# Patient Record
Sex: Female | Born: 1940 | Race: White | Hispanic: No | Marital: Married | Smoking: Never smoker
Health system: Southern US, Community
[De-identification: ages and names within clinical notes are randomized; demographics above are authoritative.]

## PROBLEM LIST (undated history)

## (undated) DIAGNOSIS — R51 Headache: Secondary | ICD-10-CM

## (undated) DIAGNOSIS — D649 Anemia, unspecified: Secondary | ICD-10-CM

## (undated) DIAGNOSIS — I10 Essential (primary) hypertension: Secondary | ICD-10-CM

## (undated) DIAGNOSIS — M1990 Unspecified osteoarthritis, unspecified site: Secondary | ICD-10-CM

## (undated) DIAGNOSIS — Z8669 Personal history of other diseases of the nervous system and sense organs: Secondary | ICD-10-CM

## (undated) DIAGNOSIS — K219 Gastro-esophageal reflux disease without esophagitis: Secondary | ICD-10-CM

## (undated) DIAGNOSIS — I341 Nonrheumatic mitral (valve) prolapse: Secondary | ICD-10-CM

## (undated) DIAGNOSIS — R519 Headache, unspecified: Secondary | ICD-10-CM

## (undated) DIAGNOSIS — Z8489 Family history of other specified conditions: Secondary | ICD-10-CM

## (undated) HISTORY — PX: TRIGGER FINGER RELEASE: SHX641

## (undated) HISTORY — PX: COLONOSCOPY: SHX174

---

## 2009-09-09 ENCOUNTER — Ambulatory Visit: Payer: Self-pay | Admitting: Cardiology

## 2011-04-06 DIAGNOSIS — S0120XA Unspecified open wound of nose, initial encounter: Secondary | ICD-10-CM | POA: Diagnosis not present

## 2011-04-06 DIAGNOSIS — M069 Rheumatoid arthritis, unspecified: Secondary | ICD-10-CM | POA: Diagnosis not present

## 2011-04-06 DIAGNOSIS — I10 Essential (primary) hypertension: Secondary | ICD-10-CM | POA: Diagnosis not present

## 2011-04-06 DIAGNOSIS — K219 Gastro-esophageal reflux disease without esophagitis: Secondary | ICD-10-CM | POA: Diagnosis not present

## 2011-07-05 DIAGNOSIS — L570 Actinic keratosis: Secondary | ICD-10-CM | POA: Diagnosis not present

## 2011-10-04 DIAGNOSIS — J0100 Acute maxillary sinusitis, unspecified: Secondary | ICD-10-CM | POA: Diagnosis not present

## 2012-01-03 DIAGNOSIS — I10 Essential (primary) hypertension: Secondary | ICD-10-CM | POA: Diagnosis not present

## 2012-01-03 DIAGNOSIS — M069 Rheumatoid arthritis, unspecified: Secondary | ICD-10-CM | POA: Diagnosis not present

## 2012-01-03 DIAGNOSIS — R5381 Other malaise: Secondary | ICD-10-CM | POA: Diagnosis not present

## 2012-01-03 DIAGNOSIS — Z23 Encounter for immunization: Secondary | ICD-10-CM | POA: Diagnosis not present

## 2012-01-03 DIAGNOSIS — E559 Vitamin D deficiency, unspecified: Secondary | ICD-10-CM | POA: Diagnosis not present

## 2012-01-03 DIAGNOSIS — Z130 Encounter for screening for diseases of the blood and blood-forming organs and certain disorders involving the immune mechanism: Secondary | ICD-10-CM | POA: Diagnosis not present

## 2012-01-03 DIAGNOSIS — Z1329 Encounter for screening for other suspected endocrine disorder: Secondary | ICD-10-CM | POA: Diagnosis not present

## 2012-01-05 DIAGNOSIS — H11049 Peripheral pterygium, stationary, unspecified eye: Secondary | ICD-10-CM | POA: Diagnosis not present

## 2012-01-05 DIAGNOSIS — Z79899 Other long term (current) drug therapy: Secondary | ICD-10-CM | POA: Diagnosis not present

## 2012-01-05 DIAGNOSIS — H2510 Age-related nuclear cataract, unspecified eye: Secondary | ICD-10-CM | POA: Diagnosis not present

## 2012-01-13 DIAGNOSIS — Z79899 Other long term (current) drug therapy: Secondary | ICD-10-CM | POA: Diagnosis not present

## 2012-02-03 DIAGNOSIS — F17200 Nicotine dependence, unspecified, uncomplicated: Secondary | ICD-10-CM | POA: Diagnosis not present

## 2012-02-03 DIAGNOSIS — Z882 Allergy status to sulfonamides status: Secondary | ICD-10-CM | POA: Diagnosis not present

## 2012-02-03 DIAGNOSIS — K219 Gastro-esophageal reflux disease without esophagitis: Secondary | ICD-10-CM | POA: Diagnosis not present

## 2012-02-03 DIAGNOSIS — K5732 Diverticulitis of large intestine without perforation or abscess without bleeding: Secondary | ICD-10-CM | POA: Diagnosis not present

## 2012-02-03 DIAGNOSIS — M129 Arthropathy, unspecified: Secondary | ICD-10-CM | POA: Diagnosis not present

## 2012-02-03 DIAGNOSIS — K5730 Diverticulosis of large intestine without perforation or abscess without bleeding: Secondary | ICD-10-CM | POA: Diagnosis not present

## 2012-02-03 DIAGNOSIS — Z1211 Encounter for screening for malignant neoplasm of colon: Secondary | ICD-10-CM | POA: Diagnosis not present

## 2012-02-03 DIAGNOSIS — I10 Essential (primary) hypertension: Secondary | ICD-10-CM | POA: Diagnosis not present

## 2012-04-04 DIAGNOSIS — J0110 Acute frontal sinusitis, unspecified: Secondary | ICD-10-CM | POA: Diagnosis not present

## 2012-06-01 DIAGNOSIS — Z1231 Encounter for screening mammogram for malignant neoplasm of breast: Secondary | ICD-10-CM | POA: Diagnosis not present

## 2012-07-03 DIAGNOSIS — Z0000 Encounter for general adult medical examination without abnormal findings: Secondary | ICD-10-CM | POA: Diagnosis not present

## 2012-07-03 DIAGNOSIS — I10 Essential (primary) hypertension: Secondary | ICD-10-CM | POA: Diagnosis not present

## 2012-10-16 DIAGNOSIS — I10 Essential (primary) hypertension: Secondary | ICD-10-CM | POA: Diagnosis not present

## 2012-11-01 DIAGNOSIS — Z01419 Encounter for gynecological examination (general) (routine) without abnormal findings: Secondary | ICD-10-CM | POA: Diagnosis not present

## 2012-12-21 DIAGNOSIS — Z23 Encounter for immunization: Secondary | ICD-10-CM | POA: Diagnosis not present

## 2013-01-16 DIAGNOSIS — I10 Essential (primary) hypertension: Secondary | ICD-10-CM | POA: Diagnosis not present

## 2013-01-16 DIAGNOSIS — Z131 Encounter for screening for diabetes mellitus: Secondary | ICD-10-CM | POA: Diagnosis not present

## 2013-05-04 DIAGNOSIS — I10 Essential (primary) hypertension: Secondary | ICD-10-CM | POA: Diagnosis not present

## 2013-08-09 DIAGNOSIS — I10 Essential (primary) hypertension: Secondary | ICD-10-CM | POA: Diagnosis not present

## 2013-08-09 DIAGNOSIS — Z0000 Encounter for general adult medical examination without abnormal findings: Secondary | ICD-10-CM | POA: Diagnosis not present

## 2013-11-12 DIAGNOSIS — I10 Essential (primary) hypertension: Secondary | ICD-10-CM | POA: Diagnosis not present

## 2013-11-12 DIAGNOSIS — K219 Gastro-esophageal reflux disease without esophagitis: Secondary | ICD-10-CM | POA: Diagnosis not present

## 2013-11-26 DIAGNOSIS — Z23 Encounter for immunization: Secondary | ICD-10-CM | POA: Diagnosis not present

## 2013-11-26 DIAGNOSIS — Z1231 Encounter for screening mammogram for malignant neoplasm of breast: Secondary | ICD-10-CM | POA: Diagnosis not present

## 2014-02-12 DIAGNOSIS — K219 Gastro-esophageal reflux disease without esophagitis: Secondary | ICD-10-CM | POA: Diagnosis not present

## 2014-02-12 DIAGNOSIS — Z131 Encounter for screening for diabetes mellitus: Secondary | ICD-10-CM | POA: Diagnosis not present

## 2014-02-12 DIAGNOSIS — I10 Essential (primary) hypertension: Secondary | ICD-10-CM | POA: Diagnosis not present

## 2014-02-12 DIAGNOSIS — M059 Rheumatoid arthritis with rheumatoid factor, unspecified: Secondary | ICD-10-CM | POA: Diagnosis not present

## 2014-05-23 DIAGNOSIS — M059 Rheumatoid arthritis with rheumatoid factor, unspecified: Secondary | ICD-10-CM | POA: Diagnosis not present

## 2014-05-23 DIAGNOSIS — I10 Essential (primary) hypertension: Secondary | ICD-10-CM | POA: Diagnosis not present

## 2014-08-23 DIAGNOSIS — M059 Rheumatoid arthritis with rheumatoid factor, unspecified: Secondary | ICD-10-CM | POA: Diagnosis not present

## 2014-08-23 DIAGNOSIS — I10 Essential (primary) hypertension: Secondary | ICD-10-CM | POA: Diagnosis not present

## 2014-08-23 DIAGNOSIS — J028 Acute pharyngitis due to other specified organisms: Secondary | ICD-10-CM | POA: Diagnosis not present

## 2014-08-23 DIAGNOSIS — Z131 Encounter for screening for diabetes mellitus: Secondary | ICD-10-CM | POA: Diagnosis not present

## 2014-11-26 DIAGNOSIS — M05852 Other rheumatoid arthritis with rheumatoid factor of left hip: Secondary | ICD-10-CM | POA: Diagnosis not present

## 2014-11-26 DIAGNOSIS — I10 Essential (primary) hypertension: Secondary | ICD-10-CM | POA: Diagnosis not present

## 2015-02-28 DIAGNOSIS — Z131 Encounter for screening for diabetes mellitus: Secondary | ICD-10-CM | POA: Diagnosis not present

## 2015-02-28 DIAGNOSIS — M0589 Other rheumatoid arthritis with rheumatoid factor of multiple sites: Secondary | ICD-10-CM | POA: Diagnosis not present

## 2015-02-28 DIAGNOSIS — I10 Essential (primary) hypertension: Secondary | ICD-10-CM | POA: Diagnosis not present

## 2015-02-28 DIAGNOSIS — Z0000 Encounter for general adult medical examination without abnormal findings: Secondary | ICD-10-CM | POA: Diagnosis not present

## 2015-02-28 DIAGNOSIS — Z1389 Encounter for screening for other disorder: Secondary | ICD-10-CM | POA: Diagnosis not present

## 2015-02-28 DIAGNOSIS — M542 Cervicalgia: Secondary | ICD-10-CM | POA: Diagnosis not present

## 2015-03-24 DIAGNOSIS — Z8262 Family history of osteoporosis: Secondary | ICD-10-CM | POA: Diagnosis not present

## 2015-03-24 DIAGNOSIS — M8589 Other specified disorders of bone density and structure, multiple sites: Secondary | ICD-10-CM | POA: Diagnosis not present

## 2015-03-24 DIAGNOSIS — I10 Essential (primary) hypertension: Secondary | ICD-10-CM | POA: Diagnosis not present

## 2015-03-24 DIAGNOSIS — K219 Gastro-esophageal reflux disease without esophagitis: Secondary | ICD-10-CM | POA: Diagnosis not present

## 2015-03-24 DIAGNOSIS — Z780 Asymptomatic menopausal state: Secondary | ICD-10-CM | POA: Diagnosis not present

## 2015-03-24 DIAGNOSIS — M419 Scoliosis, unspecified: Secondary | ICD-10-CM | POA: Diagnosis not present

## 2015-03-24 DIAGNOSIS — M810 Age-related osteoporosis without current pathological fracture: Secondary | ICD-10-CM | POA: Diagnosis not present

## 2015-03-24 DIAGNOSIS — Z79899 Other long term (current) drug therapy: Secondary | ICD-10-CM | POA: Diagnosis not present

## 2015-03-31 DIAGNOSIS — Z23 Encounter for immunization: Secondary | ICD-10-CM | POA: Diagnosis not present

## 2015-05-30 DIAGNOSIS — I10 Essential (primary) hypertension: Secondary | ICD-10-CM | POA: Diagnosis not present

## 2015-05-30 DIAGNOSIS — M0589 Other rheumatoid arthritis with rheumatoid factor of multiple sites: Secondary | ICD-10-CM | POA: Diagnosis not present

## 2015-05-30 DIAGNOSIS — M810 Age-related osteoporosis without current pathological fracture: Secondary | ICD-10-CM | POA: Diagnosis not present

## 2015-07-01 DIAGNOSIS — I10 Essential (primary) hypertension: Secondary | ICD-10-CM | POA: Diagnosis not present

## 2015-07-01 DIAGNOSIS — H6121 Impacted cerumen, right ear: Secondary | ICD-10-CM | POA: Diagnosis not present

## 2015-07-16 DIAGNOSIS — L821 Other seborrheic keratosis: Secondary | ICD-10-CM | POA: Diagnosis not present

## 2015-07-16 DIAGNOSIS — D1801 Hemangioma of skin and subcutaneous tissue: Secondary | ICD-10-CM | POA: Diagnosis not present

## 2015-08-01 DIAGNOSIS — G43109 Migraine with aura, not intractable, without status migrainosus: Secondary | ICD-10-CM | POA: Diagnosis not present

## 2015-08-01 DIAGNOSIS — I10 Essential (primary) hypertension: Secondary | ICD-10-CM | POA: Diagnosis not present

## 2015-09-05 DIAGNOSIS — M0589 Other rheumatoid arthritis with rheumatoid factor of multiple sites: Secondary | ICD-10-CM | POA: Diagnosis not present

## 2015-09-05 DIAGNOSIS — G43109 Migraine with aura, not intractable, without status migrainosus: Secondary | ICD-10-CM | POA: Diagnosis not present

## 2015-09-05 DIAGNOSIS — I10 Essential (primary) hypertension: Secondary | ICD-10-CM | POA: Diagnosis not present

## 2015-09-10 DIAGNOSIS — I10 Essential (primary) hypertension: Secondary | ICD-10-CM | POA: Diagnosis not present

## 2015-09-10 DIAGNOSIS — Z79899 Other long term (current) drug therapy: Secondary | ICD-10-CM | POA: Diagnosis not present

## 2015-09-22 DIAGNOSIS — Z1231 Encounter for screening mammogram for malignant neoplasm of breast: Secondary | ICD-10-CM | POA: Diagnosis not present

## 2015-11-21 DIAGNOSIS — Z23 Encounter for immunization: Secondary | ICD-10-CM | POA: Diagnosis not present

## 2015-12-09 DIAGNOSIS — M0589 Other rheumatoid arthritis with rheumatoid factor of multiple sites: Secondary | ICD-10-CM | POA: Diagnosis not present

## 2015-12-09 DIAGNOSIS — I10 Essential (primary) hypertension: Secondary | ICD-10-CM | POA: Diagnosis not present

## 2015-12-09 DIAGNOSIS — G43109 Migraine with aura, not intractable, without status migrainosus: Secondary | ICD-10-CM | POA: Diagnosis not present

## 2016-03-22 DIAGNOSIS — M0589 Other rheumatoid arthritis with rheumatoid factor of multiple sites: Secondary | ICD-10-CM | POA: Diagnosis not present

## 2016-03-22 DIAGNOSIS — Z131 Encounter for screening for diabetes mellitus: Secondary | ICD-10-CM | POA: Diagnosis not present

## 2016-03-22 DIAGNOSIS — G43109 Migraine with aura, not intractable, without status migrainosus: Secondary | ICD-10-CM | POA: Diagnosis not present

## 2016-03-22 DIAGNOSIS — Z1389 Encounter for screening for other disorder: Secondary | ICD-10-CM | POA: Diagnosis not present

## 2016-03-22 DIAGNOSIS — I10 Essential (primary) hypertension: Secondary | ICD-10-CM | POA: Diagnosis not present

## 2016-03-22 DIAGNOSIS — Z0000 Encounter for general adult medical examination without abnormal findings: Secondary | ICD-10-CM | POA: Diagnosis not present

## 2016-03-22 DIAGNOSIS — N182 Chronic kidney disease, stage 2 (mild): Secondary | ICD-10-CM | POA: Diagnosis not present

## 2016-04-26 DIAGNOSIS — M1990 Unspecified osteoarthritis, unspecified site: Secondary | ICD-10-CM | POA: Diagnosis not present

## 2016-04-26 DIAGNOSIS — W08XXXA Fall from other furniture, initial encounter: Secondary | ICD-10-CM | POA: Diagnosis not present

## 2016-04-26 DIAGNOSIS — S82122A Displaced fracture of lateral condyle of left tibia, initial encounter for closed fracture: Secondary | ICD-10-CM | POA: Diagnosis not present

## 2016-04-26 DIAGNOSIS — Z79899 Other long term (current) drug therapy: Secondary | ICD-10-CM | POA: Diagnosis not present

## 2016-04-26 DIAGNOSIS — S82142A Displaced bicondylar fracture of left tibia, initial encounter for closed fracture: Secondary | ICD-10-CM | POA: Diagnosis not present

## 2016-04-26 DIAGNOSIS — I10 Essential (primary) hypertension: Secondary | ICD-10-CM | POA: Diagnosis not present

## 2016-04-26 DIAGNOSIS — S82832A Other fracture of upper and lower end of left fibula, initial encounter for closed fracture: Secondary | ICD-10-CM | POA: Diagnosis not present

## 2016-04-26 DIAGNOSIS — K219 Gastro-esophageal reflux disease without esophagitis: Secondary | ICD-10-CM | POA: Diagnosis not present

## 2016-04-27 DIAGNOSIS — S89002D Unspecified physeal fracture of upper end of left tibia, subsequent encounter for fracture with routine healing: Secondary | ICD-10-CM | POA: Diagnosis not present

## 2016-05-03 DIAGNOSIS — S82832A Other fracture of upper and lower end of left fibula, initial encounter for closed fracture: Secondary | ICD-10-CM | POA: Diagnosis not present

## 2016-05-03 DIAGNOSIS — S82142A Displaced bicondylar fracture of left tibia, initial encounter for closed fracture: Secondary | ICD-10-CM | POA: Diagnosis not present

## 2016-05-04 DIAGNOSIS — S89002D Unspecified physeal fracture of upper end of left tibia, subsequent encounter for fracture with routine healing: Secondary | ICD-10-CM | POA: Diagnosis not present

## 2016-05-05 DIAGNOSIS — S82142A Displaced bicondylar fracture of left tibia, initial encounter for closed fracture: Secondary | ICD-10-CM | POA: Diagnosis not present

## 2016-05-06 ENCOUNTER — Encounter (HOSPITAL_COMMUNITY): Payer: Self-pay | Admitting: *Deleted

## 2016-05-06 NOTE — Progress Notes (Signed)
Spoke with pt for pre-op call. 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 Pt states she is not diabetic.  Pt states she is having surgery on her left knee. It has been scheduled as her right knee. I've called and spoke with Gwen at Dr. Carlean Jews office and she will check with the PA/Dr. Marcelino Scot and call me back.

## 2016-05-06 NOTE — Anesthesia Preprocedure Evaluation (Addendum)
Anesthesia Evaluation    Reviewed: Allergy & Precautions, Patient's Chart, lab work & pertinent test results, reviewed documented beta blocker date and time   Airway Mallampati: II  TM Distance: >3 FB Neck ROM: Full    Dental  (+) Teeth Intact   Pulmonary neg pulmonary ROS, asthma (past history, no meds now) ,    breath sounds clear to auscultation       Cardiovascular hypertension, negative cardio ROS   Rhythm:Regular Rate:Normal     Neuro/Psych  Headaches, negative neurological ROS     GI/Hepatic negative GI ROS, Neg liver ROS, GERD  Medicated and Controlled,  Endo/Other  negative endocrine ROS  Renal/GU negative Renal ROS     Musculoskeletal negative musculoskeletal ROS (+) Arthritis ,   Abdominal   Peds  Hematology negative hematology ROS (+) anemia ,   Anesthesia Other Findings Day of surgery medications reviewed with the patient.  Reproductive/Obstetrics                            Anesthesia Physical Anesthesia Plan  ASA: II  Anesthesia Plan: Spinal   Post-op Pain Management:    Induction:   Airway Management Planned:   Additional Equipment:   Intra-op Plan:   Post-operative Plan:   Informed Consent: I have reviewed the patients History and Physical, chart, labs and discussed the procedure including the risks, benefits and alternatives for the proposed anesthesia with the patient or authorized representative who has indicated his/her understanding and acceptance.   Dental advisory given  Plan Discussed with: CRNA  Anesthesia Plan Comments: (Patient prefers spinal, GA stand by)       Anesthesia Quick Evaluation

## 2016-05-07 ENCOUNTER — Encounter (HOSPITAL_COMMUNITY)
Admission: RE | Disposition: A | Discharge status: Home Health | Payer: Self-pay | Source: Ambulatory Visit | Attending: Orthopedic Surgery

## 2016-05-07 ENCOUNTER — Inpatient Hospital Stay (HOSPITAL_COMMUNITY): Payer: Medicare Other

## 2016-05-07 ENCOUNTER — Encounter (HOSPITAL_COMMUNITY): Payer: Self-pay | Admitting: *Deleted

## 2016-05-07 ENCOUNTER — Inpatient Hospital Stay (HOSPITAL_COMMUNITY): Payer: Medicare Other | Admitting: Certified Registered Nurse Anesthetist

## 2016-05-07 ENCOUNTER — Inpatient Hospital Stay (HOSPITAL_COMMUNITY)
Admission: RE | Admit: 2016-05-07 | Discharge: 2016-05-10 | DRG: 488 | Disposition: A | Discharge status: Home Health | Payer: Medicare Other | Source: Ambulatory Visit | Attending: Orthopedic Surgery | Admitting: Orthopedic Surgery

## 2016-05-07 DIAGNOSIS — S83262A Peripheral tear of lateral meniscus, current injury, left knee, initial encounter: Secondary | ICD-10-CM | POA: Diagnosis not present

## 2016-05-07 DIAGNOSIS — Y93E9 Activity, other interior property and clothing maintenance: Secondary | ICD-10-CM

## 2016-05-07 DIAGNOSIS — D62 Acute posthemorrhagic anemia: Secondary | ICD-10-CM | POA: Diagnosis not present

## 2016-05-07 DIAGNOSIS — Z01818 Encounter for other preprocedural examination: Secondary | ICD-10-CM | POA: Diagnosis not present

## 2016-05-07 DIAGNOSIS — M25562 Pain in left knee: Secondary | ICD-10-CM | POA: Diagnosis not present

## 2016-05-07 DIAGNOSIS — S83282A Other tear of lateral meniscus, current injury, left knee, initial encounter: Secondary | ICD-10-CM | POA: Diagnosis present

## 2016-05-07 DIAGNOSIS — I10 Essential (primary) hypertension: Secondary | ICD-10-CM | POA: Diagnosis present

## 2016-05-07 DIAGNOSIS — Z825 Family history of asthma and other chronic lower respiratory diseases: Secondary | ICD-10-CM

## 2016-05-07 DIAGNOSIS — Z419 Encounter for procedure for purposes other than remedying health state, unspecified: Secondary | ICD-10-CM

## 2016-05-07 DIAGNOSIS — S82141A Displaced bicondylar fracture of right tibia, initial encounter for closed fracture: Secondary | ICD-10-CM | POA: Diagnosis not present

## 2016-05-07 DIAGNOSIS — W08XXXA Fall from other furniture, initial encounter: Secondary | ICD-10-CM | POA: Diagnosis present

## 2016-05-07 DIAGNOSIS — Z8669 Personal history of other diseases of the nervous system and sense organs: Secondary | ICD-10-CM

## 2016-05-07 DIAGNOSIS — S82143A Displaced bicondylar fracture of unspecified tibia, initial encounter for closed fracture: Secondary | ICD-10-CM | POA: Diagnosis present

## 2016-05-07 DIAGNOSIS — S82142A Displaced bicondylar fracture of left tibia, initial encounter for closed fracture: Principal | ICD-10-CM | POA: Diagnosis present

## 2016-05-07 DIAGNOSIS — Z8249 Family history of ischemic heart disease and other diseases of the circulatory system: Secondary | ICD-10-CM | POA: Diagnosis not present

## 2016-05-07 DIAGNOSIS — K219 Gastro-esophageal reflux disease without esophagitis: Secondary | ICD-10-CM | POA: Diagnosis present

## 2016-05-07 HISTORY — DX: Personal history of other diseases of the nervous system and sense organs: Z86.69

## 2016-05-07 HISTORY — DX: Headache: R51

## 2016-05-07 HISTORY — DX: Family history of other specified conditions: Z84.89

## 2016-05-07 HISTORY — DX: Nonrheumatic mitral (valve) prolapse: I34.1

## 2016-05-07 HISTORY — PX: ORIF TIBIA PLATEAU: SHX2132

## 2016-05-07 HISTORY — DX: Gastro-esophageal reflux disease without esophagitis: K21.9

## 2016-05-07 HISTORY — DX: Essential (primary) hypertension: I10

## 2016-05-07 HISTORY — DX: Unspecified osteoarthritis, unspecified site: M19.90

## 2016-05-07 HISTORY — DX: Anemia, unspecified: D64.9

## 2016-05-07 HISTORY — DX: Headache, unspecified: R51.9

## 2016-05-07 LAB — CBC WITH DIFFERENTIAL/PLATELET
Basophils Absolute: 0 10*3/uL (ref 0.0–0.1)
Basophils Relative: 0 %
EOS ABS: 0.1 10*3/uL (ref 0.0–0.7)
Eosinophils Relative: 1 %
HCT: 33 % — ABNORMAL LOW (ref 36.0–46.0)
Hemoglobin: 10.7 g/dL — ABNORMAL LOW (ref 12.0–15.0)
LYMPHS ABS: 1.8 10*3/uL (ref 0.7–4.0)
Lymphocytes Relative: 16 %
MCH: 29.5 pg (ref 26.0–34.0)
MCHC: 32.4 g/dL (ref 30.0–36.0)
MCV: 90.9 fL (ref 78.0–100.0)
MONOS PCT: 12 %
Monocytes Absolute: 1.3 10*3/uL — ABNORMAL HIGH (ref 0.1–1.0)
NEUTROS PCT: 71 %
Neutro Abs: 8.2 10*3/uL — ABNORMAL HIGH (ref 1.7–7.7)
PLATELETS: 377 10*3/uL (ref 150–400)
RBC: 3.63 MIL/uL — ABNORMAL LOW (ref 3.87–5.11)
RDW: 13.7 % (ref 11.5–15.5)
WBC: 11.4 10*3/uL — ABNORMAL HIGH (ref 4.0–10.5)

## 2016-05-07 LAB — TYPE AND SCREEN
ABO/RH(D): A POS
ANTIBODY SCREEN: NEGATIVE

## 2016-05-07 LAB — COMPREHENSIVE METABOLIC PANEL
ALBUMIN: 3.9 g/dL (ref 3.5–5.0)
ALK PHOS: 52 U/L (ref 38–126)
ALT: 24 U/L (ref 14–54)
AST: 25 U/L (ref 15–41)
Anion gap: 14 (ref 5–15)
BUN: 18 mg/dL (ref 6–20)
CALCIUM: 9.3 mg/dL (ref 8.9–10.3)
CHLORIDE: 100 mmol/L — AB (ref 101–111)
CO2: 25 mmol/L (ref 22–32)
CREATININE: 0.89 mg/dL (ref 0.44–1.00)
GFR calc Af Amer: >60 mL/min (ref >60)
GFR calc non Af Amer: >60 mL/min (ref >60)
GLUCOSE: 121 mg/dL — AB (ref 65–99)
Potassium: 3.2 mmol/L — ABNORMAL LOW (ref 3.5–5.1)
SODIUM: 139 mmol/L (ref 135–145)
Total Bilirubin: 0.6 mg/dL (ref 0.3–1.2)
Total Protein: 6.6 g/dL (ref 6.5–8.1)

## 2016-05-07 LAB — ABO/RH: ABO/RH(D): A POS

## 2016-05-07 LAB — APTT: aPTT: 28 seconds (ref 24–36)

## 2016-05-07 LAB — PROTIME-INR
INR: 1.04
Prothrombin Time: 13.7 seconds (ref 11.4–15.2)

## 2016-05-07 SURGERY — OPEN REDUCTION INTERNAL FIXATION (ORIF) TIBIAL PLATEAU
Anesthesia: Regional | Site: Knee | Laterality: Left

## 2016-05-07 MED ORDER — HYDROXYCHLOROQUINE SULFATE 200 MG PO TABS
200.0000 mg | ORAL_TABLET | Freq: Two times a day (BID) | ORAL | Status: DC
  Administered 2016-05-07 – 2016-05-10 (×6): 200 mg via ORAL
  Filled 2016-05-07 (×6): qty 1

## 2016-05-07 MED ORDER — MEPERIDINE HCL 25 MG/ML IJ SOLN: 12.5000 mg | INTRAMUSCULAR | Status: DC | PRN

## 2016-05-07 MED ORDER — LIDOCAINE 2% (20 MG/ML) 5 ML SYRINGE
INTRAMUSCULAR | Status: DC | PRN
  Administered 2016-05-07: 40 mg via INTRAVENOUS

## 2016-05-07 MED ORDER — FENTANYL CITRATE (PF) 100 MCG/2ML IJ SOLN: 25.0000 ug | INTRAMUSCULAR | Status: DC | PRN

## 2016-05-07 MED ORDER — DIPHENHYDRAMINE HCL 12.5 MG/5ML PO ELIX: 12.5000 mg | ORAL_SOLUTION | ORAL | Status: DC | PRN

## 2016-05-07 MED ORDER — METOCLOPRAMIDE HCL 5 MG/ML IJ SOLN: 5.0000 mg | Freq: Three times a day (TID) | INTRAMUSCULAR | Status: DC | PRN

## 2016-05-07 MED ORDER — POLYETHYLENE GLYCOL 3350 17 G PO PACK
17.0000 g | PACK | Freq: Every day | ORAL | Status: DC
Start: 2016-05-07 — End: 2016-05-10
  Administered 2016-05-07 – 2016-05-10 (×4): 17 g via ORAL
  Filled 2016-05-07 (×4): qty 1

## 2016-05-07 MED ORDER — METHOCARBAMOL 1000 MG/10ML IJ SOLN
500.0000 mg | Freq: Four times a day (QID) | INTRAVENOUS | Status: DC | PRN
  Filled 2016-05-07: qty 5

## 2016-05-07 MED ORDER — BISACODYL 10 MG RE SUPP: 10.0000 mg | Freq: Every day | RECTAL | Status: DC | PRN

## 2016-05-07 MED ORDER — HYDROCODONE-ACETAMINOPHEN 5-325 MG PO TABS
1.0000 | ORAL_TABLET | Freq: Four times a day (QID) | ORAL | Status: DC | PRN
  Administered 2016-05-07 – 2016-05-09 (×6): 2 via ORAL
  Filled 2016-05-07 (×7): qty 2

## 2016-05-07 MED ORDER — CEFAZOLIN IN D5W 1 GM/50ML IV SOLN
1.0000 g | Freq: Four times a day (QID) | INTRAVENOUS | Status: AC
  Administered 2016-05-07 – 2016-05-08 (×3): 1 g via INTRAVENOUS
  Filled 2016-05-07 (×4): qty 50

## 2016-05-07 MED ORDER — MAGNESIUM CITRATE PO SOLN: 1.0000 | Freq: Once | ORAL | Status: DC | PRN

## 2016-05-07 MED ORDER — LACTATED RINGERS IV SOLN
INTRAVENOUS | Status: DC
  Administered 2016-05-07 (×3): via INTRAVENOUS

## 2016-05-07 MED ORDER — ONDANSETRON HCL 4 MG PO TABS: 4.0000 mg | ORAL_TABLET | Freq: Four times a day (QID) | ORAL | Status: DC | PRN

## 2016-05-07 MED ORDER — PROPOFOL 10 MG/ML IV BOLUS
INTRAVENOUS | Status: AC
  Filled 2016-05-07: qty 20

## 2016-05-07 MED ORDER — GLYCOPYRROLATE 0.2 MG/ML IJ SOLN
INTRAMUSCULAR | Status: DC | PRN
  Administered 2016-05-07: 0.2 mg via INTRAVENOUS

## 2016-05-07 MED ORDER — CHLORHEXIDINE GLUCONATE 4 % EX LIQD: 60.0000 mL | Freq: Once | CUTANEOUS | Status: DC

## 2016-05-07 MED ORDER — METOCLOPRAMIDE HCL 5 MG PO TABS
5.0000 mg | ORAL_TABLET | Freq: Three times a day (TID) | ORAL | Status: DC | PRN
Start: 2016-05-07 — End: 2016-05-10

## 2016-05-07 MED ORDER — DEXTROSE 5 % IV SOLN
INTRAVENOUS | Status: DC | PRN
  Administered 2016-05-07: 20 ug/min via INTRAVENOUS

## 2016-05-07 MED ORDER — BUPIVACAINE IN DEXTROSE 0.75-8.25 % IT SOLN
INTRATHECAL | Status: DC | PRN
  Administered 2016-05-07: 1.4 mL via INTRATHECAL

## 2016-05-07 MED ORDER — ONDANSETRON HCL 4 MG/2ML IJ SOLN: 4.0000 mg | Freq: Once | INTRAMUSCULAR | Status: DC | PRN

## 2016-05-07 MED ORDER — 0.9 % SODIUM CHLORIDE (POUR BTL) OPTIME
TOPICAL | Status: DC | PRN
  Administered 2016-05-07: 1000 mL

## 2016-05-07 MED ORDER — PROPOFOL 500 MG/50ML IV EMUL
INTRAVENOUS | Status: DC | PRN
  Administered 2016-05-07: 50 ug/kg/min via INTRAVENOUS

## 2016-05-07 MED ORDER — DOCUSATE SODIUM 100 MG PO CAPS
100.0000 mg | ORAL_CAPSULE | Freq: Two times a day (BID) | ORAL | Status: DC
  Administered 2016-05-07 – 2016-05-10 (×6): 100 mg via ORAL
  Filled 2016-05-07 (×6): qty 1

## 2016-05-07 MED ORDER — FENTANYL CITRATE (PF) 100 MCG/2ML IJ SOLN
INTRAMUSCULAR | Status: AC
  Filled 2016-05-07: qty 2

## 2016-05-07 MED ORDER — DIPHENHYDRAMINE HCL 50 MG/ML IJ SOLN
INTRAMUSCULAR | Status: DC | PRN
  Administered 2016-05-07: 12.5 mg via INTRAVENOUS

## 2016-05-07 MED ORDER — CEFAZOLIN SODIUM-DEXTROSE 2-4 GM/100ML-% IV SOLN
2.0000 g | INTRAVENOUS | Status: AC
  Administered 2016-05-07: 2 g via INTRAVENOUS
  Filled 2016-05-07: qty 100

## 2016-05-07 MED ORDER — FENTANYL CITRATE (PF) 100 MCG/2ML IJ SOLN
INTRAMUSCULAR | Status: DC | PRN
  Administered 2016-05-07: 20 ug via INTRATHECAL
  Administered 2016-05-07: 25 ug via INTRAVENOUS

## 2016-05-07 MED ORDER — ONDANSETRON HCL 4 MG/2ML IJ SOLN: 4.0000 mg | Freq: Four times a day (QID) | INTRAMUSCULAR | Status: DC | PRN

## 2016-05-07 MED ORDER — MIDAZOLAM HCL 2 MG/2ML IJ SOLN
INTRAMUSCULAR | Status: AC
  Filled 2016-05-07: qty 2

## 2016-05-07 MED ORDER — ASPIRIN-ACETAMINOPHEN-CAFFEINE 250-250-65 MG PO TABS
1.0000 | ORAL_TABLET | Freq: Two times a day (BID) | ORAL | Status: DC | PRN
  Filled 2016-05-07: qty 1

## 2016-05-07 MED ORDER — METHOCARBAMOL 500 MG PO TABS
500.0000 mg | ORAL_TABLET | Freq: Four times a day (QID) | ORAL | Status: DC | PRN
Start: 2016-05-07 — End: 2016-05-10
  Administered 2016-05-07 – 2016-05-08 (×2): 500 mg via ORAL
  Filled 2016-05-07 (×2): qty 1

## 2016-05-07 MED ORDER — ENOXAPARIN SODIUM 40 MG/0.4ML ~~LOC~~ SOLN
40.0000 mg | SUBCUTANEOUS | Status: DC
  Administered 2016-05-08 – 2016-05-10 (×3): 40 mg via SUBCUTANEOUS
  Filled 2016-05-07 (×3): qty 0.4

## 2016-05-07 MED ORDER — EPHEDRINE SULFATE 50 MG/ML IJ SOLN
INTRAMUSCULAR | Status: DC | PRN
  Administered 2016-05-07: 10 mg via INTRAVENOUS
  Administered 2016-05-07: 5 mg via INTRAVENOUS

## 2016-05-07 MED ORDER — ACETAMINOPHEN 325 MG PO TABS: 650.0000 mg | ORAL_TABLET | Freq: Four times a day (QID) | ORAL | Status: DC | PRN

## 2016-05-07 MED ORDER — POTASSIUM CHLORIDE IN NACL 20-0.9 MEQ/L-% IV SOLN
INTRAVENOUS | Status: DC
  Administered 2016-05-07: 18:00:00 via INTRAVENOUS
  Filled 2016-05-07 (×3): qty 1000

## 2016-05-07 MED ORDER — ACETAMINOPHEN 650 MG RE SUPP: 650.0000 mg | Freq: Four times a day (QID) | RECTAL | Status: DC | PRN

## 2016-05-07 MED ORDER — MORPHINE SULFATE (PF) 2 MG/ML IV SOLN
1.0000 mg | INTRAVENOUS | Status: DC | PRN
  Administered 2016-05-07 – 2016-05-08 (×2): 2 mg via INTRAVENOUS
  Filled 2016-05-07 (×2): qty 1

## 2016-05-07 SURGICAL SUPPLY — 99 items
BANDAGE ACE 4X5 VEL STRL LF (GAUZE/BANDAGES/DRESSINGS) ×3 IMPLANT
BANDAGE ACE 6X5 VEL STRL LF (GAUZE/BANDAGES/DRESSINGS) ×3 IMPLANT
BANDAGE ELASTIC 4 VELCRO ST LF (GAUZE/BANDAGES/DRESSINGS) ×3 IMPLANT
BANDAGE ELASTIC 6 VELCRO ST LF (GAUZE/BANDAGES/DRESSINGS) ×3 IMPLANT
BANDAGE ESMARK 6X9 LF (GAUZE/BANDAGES/DRESSINGS) ×1 IMPLANT
BIT DRILL 100X2.5XANTM LCK (BIT) ×1 IMPLANT
BIT DRILL CAL (BIT) ×1 IMPLANT
BIT DRL 100X2.5XANTM LCK (BIT) ×1
BLADE CLIPPER SURG (BLADE) IMPLANT
BLADE SURG 10 STRL SS (BLADE) ×3 IMPLANT
BLADE SURG 15 STRL LF DISP TIS (BLADE) ×1 IMPLANT
BLADE SURG 15 STRL SS (BLADE) ×2
BNDG COHESIVE 4X5 TAN STRL (GAUZE/BANDAGES/DRESSINGS) IMPLANT
BNDG ESMARK 6X9 LF (GAUZE/BANDAGES/DRESSINGS) ×3
BNDG GAUZE ELAST 4 BULKY (GAUZE/BANDAGES/DRESSINGS) ×3 IMPLANT
BONE CANC CHIPS 40CC CAN1/2 (Bone Implant) ×3 IMPLANT
BRUSH SCRUB DISP (MISCELLANEOUS) ×6 IMPLANT
CANISTER SUCT 3000ML PPV (MISCELLANEOUS) ×3 IMPLANT
CHIPS CANC BONE 40CC CAN1/2 (Bone Implant) ×1 IMPLANT
COVER SURGICAL LIGHT HANDLE (MISCELLANEOUS) ×3 IMPLANT
CUFF TOURNIQUET SINGLE 34IN LL (TOURNIQUET CUFF) ×3 IMPLANT
DRAPE C-ARM 42X72 X-RAY (DRAPES) ×3 IMPLANT
DRAPE C-ARMOR (DRAPES) ×3 IMPLANT
DRAPE HALF SHEET 40X57 (DRAPES) IMPLANT
DRAPE INCISE IOBAN 66X45 STRL (DRAPES) ×3 IMPLANT
DRAPE ORTHO SPLIT 77X108 STRL (DRAPES)
DRAPE SURG ORHT 6 SPLT 77X108 (DRAPES) IMPLANT
DRAPE U-SHAPE 47X51 STRL (DRAPES) ×3 IMPLANT
DRILL BIT 2.5MM (BIT) ×2
DRILL BIT CAL (BIT) ×3
DRSG ADAPTIC 3X8 NADH LF (GAUZE/BANDAGES/DRESSINGS) ×3 IMPLANT
DRSG MEPITEL 4X7.2 (GAUZE/BANDAGES/DRESSINGS) ×3 IMPLANT
DRSG PAD ABDOMINAL 8X10 ST (GAUZE/BANDAGES/DRESSINGS) ×6 IMPLANT
ELECT REM PT RETURN 9FT ADLT (ELECTROSURGICAL) ×3
ELECTRODE REM PT RTRN 9FT ADLT (ELECTROSURGICAL) ×1 IMPLANT
GAUZE SPONGE 4X4 12PLY STRL (GAUZE/BANDAGES/DRESSINGS) ×3 IMPLANT
GAUZE SPONGE 4X4 16PLY XRAY LF (GAUZE/BANDAGES/DRESSINGS) ×3 IMPLANT
GLOVE BIO SURGEON STRL SZ7.5 (GLOVE) ×3 IMPLANT
GLOVE BIO SURGEON STRL SZ8 (GLOVE) ×6 IMPLANT
GLOVE BIOGEL PI IND STRL 7.5 (GLOVE) ×1 IMPLANT
GLOVE BIOGEL PI IND STRL 8 (GLOVE) ×1 IMPLANT
GLOVE BIOGEL PI INDICATOR 7.5 (GLOVE) ×2
GLOVE BIOGEL PI INDICATOR 8 (GLOVE) ×2
GOWN STRL REUS W/ TWL LRG LVL3 (GOWN DISPOSABLE) ×2 IMPLANT
GOWN STRL REUS W/ TWL XL LVL3 (GOWN DISPOSABLE) ×1 IMPLANT
GOWN STRL REUS W/TWL LRG LVL3 (GOWN DISPOSABLE) ×4
GOWN STRL REUS W/TWL XL LVL3 (GOWN DISPOSABLE) ×2
IMMOBILIZER KNEE 22 (SOFTGOODS) ×3 IMPLANT
IMMOBILIZER KNEE 22 UNIV (SOFTGOODS) ×3 IMPLANT
K-WIRE .045X6 DBL TRO NS (WIRE) ×3
K-WIRE ACE 1.6X6 (WIRE) ×18
KIT BASIN OR (CUSTOM PROCEDURE TRAY) ×3 IMPLANT
KIT ROOM TURNOVER OR (KITS) ×3 IMPLANT
KWIRE .045X6 DBL TRO NS (WIRE) ×1 IMPLANT
KWIRE ACE 1.6X6 (WIRE) ×6 IMPLANT
NDL SUT 6 .5 CRC .975X.05 MAYO (NEEDLE) ×1 IMPLANT
NEEDLE MAYO TAPER (NEEDLE) ×2
NS IRRIG 1000ML POUR BTL (IV SOLUTION) ×3 IMPLANT
PACK ORTHO EXTREMITY (CUSTOM PROCEDURE TRAY) ×3 IMPLANT
PAD ARMBOARD 7.5X6 YLW CONV (MISCELLANEOUS) ×6 IMPLANT
PAD CAST 4YDX4 CTTN HI CHSV (CAST SUPPLIES) ×1 IMPLANT
PADDING CAST ABS 6INX4YD NS (CAST SUPPLIES) ×2
PADDING CAST ABS COTTON 6X4 NS (CAST SUPPLIES) ×1 IMPLANT
PADDING CAST COTTON 4X4 STRL (CAST SUPPLIES) ×2
PADDING CAST COTTON 6X4 STRL (CAST SUPPLIES) ×3 IMPLANT
PLATE LOCK 5H STD LT PROX TIB (Plate) ×3 IMPLANT
SCREW CORT FT 32X3.5XNONLOCK (Screw) ×1 IMPLANT
SCREW CORTICAL 3.5MM  30MM (Screw) ×2 IMPLANT
SCREW CORTICAL 3.5MM  32MM (Screw) ×2 IMPLANT
SCREW CORTICAL 3.5MM 30MM (Screw) ×1 IMPLANT
SCREW CORTICAL 3.5MM 36MM (Screw) ×3 IMPLANT
SCREW LOCK CANC STAR 4X75 (Screw) ×3 IMPLANT
SCREW LOCK CORT STAR 3.5X34 (Screw) ×3 IMPLANT
SCREW LOCK CORT STAR 3.5X65 (Screw) ×6 IMPLANT
SCREW LOCK CORT STAR 3.5X70 (Screw) ×3 IMPLANT
SCREW LOCK CORT STAR 3.5X75 (Screw) ×12 IMPLANT
SCREW LOW PROF CORTICAL 3.5X80 (Screw) ×3 IMPLANT
SPONGE LAP 18X18 X RAY DECT (DISPOSABLE) ×3 IMPLANT
STAPLER VISISTAT 35W (STAPLE) ×3 IMPLANT
STOCKINETTE IMPERVIOUS LG (DRAPES) ×3 IMPLANT
SUCTION FRAZIER HANDLE 10FR (MISCELLANEOUS) ×2
SUCTION FRAZIER TIP 10 FR DISP (SUCTIONS) ×3 IMPLANT
SUCTION TUBE FRAZIER 10FR DISP (MISCELLANEOUS) ×1 IMPLANT
SUT ETHILON 3 0 PS 1 (SUTURE) ×6 IMPLANT
SUT PROLENE 0 CT 2 (SUTURE) ×6 IMPLANT
SUT VIC AB 0 CT1 27 (SUTURE) ×2
SUT VIC AB 0 CT1 27XBRD ANBCTR (SUTURE) ×1 IMPLANT
SUT VIC AB 1 CT1 27 (SUTURE) ×2
SUT VIC AB 1 CT1 27XBRD ANBCTR (SUTURE) ×1 IMPLANT
SUT VIC AB 2-0 CT1 27 (SUTURE) ×4
SUT VIC AB 2-0 CT1 TAPERPNT 27 (SUTURE) ×2 IMPLANT
SYR CONTROL 10ML LL (SYRINGE) ×6 IMPLANT
TOWEL OR 17X24 6PK STRL BLUE (TOWEL DISPOSABLE) ×3 IMPLANT
TOWEL OR 17X26 10 PK STRL BLUE (TOWEL DISPOSABLE) ×6 IMPLANT
TRAY FOLEY W/METER SILVER 16FR (SET/KITS/TRAYS/PACK) IMPLANT
TUBE CONNECTING 12'X1/4 (SUCTIONS) ×2
TUBE CONNECTING 12X1/4 (SUCTIONS) ×4 IMPLANT
WATER STERILE IRR 1000ML POUR (IV SOLUTION) IMPLANT
YANKAUER SUCT BULB TIP NO VENT (SUCTIONS) ×3 IMPLANT

## 2016-05-07 NOTE — Anesthesia Procedure Notes (Signed)
Spinal  Patient location during procedure: OR Start time: 05/07/2016 11:15 AM End time: 05/07/2016 11:24 AM Staffing Anesthesiologist: [name] Performed: anesthesiologist  Preanesthetic Checklist Completed: patient identified, site marked, surgical consent, pre-op evaluation, timeout performed, IV checked, risks and benefits discussed and monitors and equipment checked Spinal Block Patient position: right lateral decubitus Prep: Betadine Patient monitoring: heart rate, cardiac monitor, continuous pulse ox and blood pressure Approach: midline Location: L3-4 Injection technique: single-shot Needle Needle type: Quincke  Needle gauge: 22 G Needle length: 10 cm Assessment Sensory level: T10 Additional Notes Attempted to place spinal aseptically first after local anesthetic infiltration with @%G , unsuccessful, patient diaphoratic, vasopressors given, patient laid down RLD keeping aseptic field. Spinal placed with 22g 3 1/2 inch needle. Clear csf, no paresthesia no blood. Pt tolerated procedure well.attempts 3. No complications

## 2016-05-07 NOTE — Anesthesia Procedure Notes (Signed)
Procedure Name: MAC Date/Time: 05/07/2016 11:00 AM Performed by: [name] Pre-anesthesia Checklist: Patient identified, Emergency Drugs available, Suction available and Patient being monitored Patient Re-evaluated:Patient Re-evaluated prior to inductionOxygen Delivery Method: Simple face mask

## 2016-05-07 NOTE — Anesthesia Postprocedure Evaluation (Signed)
Anesthesia Post Note  Patient: Joy Freeman  Procedure(s) Performed: Procedure(s) (LRB): OPEN REDUCTION INTERNAL FIXATION (ORIF LEFT TIBIAL PLATEAU (Left)  Patient location during evaluation: PACU Anesthesia Type: Regional Level of consciousness: awake and alert Vital Signs Assessment: post-procedure vital signs reviewed and stable Respiratory status: spontaneous breathing Cardiovascular status: stable Postop Assessment: no signs of nausea or vomiting Anesthetic complications: no Comments: Patient moving both lower extremities       Last Vitals:  Vitals:   05/07/16 1415 05/07/16 1430  BP: 119/66 (!) 119/58  Pulse: 69 66  Resp: 15 14  Temp:      Last Pain:  Vitals:   05/07/16 1430  TempSrc:   PainSc: 0-No pain                 [name]

## 2016-05-07 NOTE — Care Management Note (Addendum)
Case Management Note  Patient Details  Name: Joy Freeman MRN: [id number] Date of Birth: 10-Dec-1940  Subjective/Objective:     ORIF Left bicondylar tibial plateau, repair of lateral meniscus                Action/Plan: Discharge Planning: NCM spoke to pt and husband at home to assist with care. Pt states she lives in [place], [place]. [facility] and they did not have start of care until Wed. Will follow up with other agencies on 3/17 for [facility] PT. Pt states she has RW at home.   PCP Stoney Bang A    05/08/2016 [facility] in [place]. They can accept the referral with start date of Monday 05/10/2016. Faxed HH orders, Facesheet and H&P to # [phone].    Expected Discharge Date:                Expected Discharge Plan:  [place]  In-House Referral:  NA  Discharge planning Services  CM Consult  Post Acute Care Choice:  Home Health Choice offered to:  Patient  DME Arranged:  N/A DME Agency:  NA  HH Arranged:  PT [place] Agency:  Other - See comment  Status of Service:  In process, will continue to follow  If discussed at Long Length of Stay Meetings, dates discussed:    Additional Comments:  Erenest Rasher, RN 05/07/2016, 6:09 PM

## 2016-05-07 NOTE — Progress Notes (Signed)
Orthopedic Tech Progress Note Patient Details:  Joy Freeman Jan 24, 1941 [id number]  Patient ID: [name], female   DOB: [birth year], 76 y.o.   MRN: [id number]   [name] 05/07/2016, 3:06 PMCalled [employer] for Hinged knee brace.

## 2016-05-07 NOTE — Progress Notes (Signed)
Orthopedic Tech Progress Note Patient Details:  Joy Freeman 1940/08/07 [id number]  Ortho Devices Ortho Device/Splint Location: foot roll Ortho Device/Splint Interventions: Application   [name] 05/07/2016, 3:06 PM

## 2016-05-07 NOTE — Transfer of Care (Signed)
Immediate Anesthesia Transfer of Care Note  Patient: Joy Freeman  Procedure(s) Performed: Procedure(s): OPEN REDUCTION INTERNAL FIXATION (ORIF LEFT TIBIAL PLATEAU (Left)  Patient Location: PACU  Anesthesia Type:MAC combined with regional for post-op pain  Level of Consciousness: awake, alert  and oriented  Airway & Oxygen Therapy: Patient Spontanous Breathing  Post-op Assessment: Report given to RN and Post -op Vital signs reviewed and stable  Post vital signs: Reviewed and stable  Last Vitals:  Vitals:   05/07/16 0834  BP: (!) 137/54  Pulse: 71  Resp: 18  Temp: 36.9 C    Last Pain:  Vitals:   05/07/16 0929  TempSrc:   PainSc: 3       Patients Stated Pain Goal: 7 (38/18/29 9371)  Complications: No apparent anesthesia complications

## 2016-05-07 NOTE — Brief Op Note (Signed)
05/07/2016  2:00 PM  PATIENT:  Joy Freeman  76 y.o. female  PRE-OPERATIVE DIAGNOSIS:  right bicondylar tibial plateau fracture  POST-OPERATIVE DIAGNOSIS:  right bicondylar tibial plateau fracture, LATERAL MENISCUS TEAR  PROCEDURE:  Procedure(s): 1. OPEN REDUCTION INTERNAL FIXATION ORIF LEFT BICONDYLAR TIBIAL PLATEAU (Left) 2. REPAIR OF LATERAL MENISCUS  SURGEON:  Surgeon(s) and Role:    * Altamese Sunshine, MD - Primary  PHYSICIAN ASSISTANT: Ainsley Spinner   ANESTHESIA:   general  EBL:  No intake/output data recorded.  BLOOD ADMINISTERED:none  DRAINS: none   LOCAL MEDICATIONS USED:  NONE  SPECIMEN:  No Specimen  DISPOSITION OF SPECIMEN:  N/A  COUNTS:  YES  TOURNIQUET:   Total Tourniquet Time Documented: Thigh (Left) - 107 minutes Total: Thigh (Left) - 107 minutes   DICTATION: .Other Dictation: Dictation Number [id number]  PLAN OF CARE: Admit to inpatient   PATIENT DISPOSITION:  PACU - hemodynamically stable.   Delay start of Pharmacological VTE agent (>24hrs) due to surgical blood loss or risk of bleeding: no

## 2016-05-07 NOTE — H&P (Signed)
Orthopaedic Trauma Service H&P  Chief Complaint: Left tibial plateau fracture HPI:   76 y/o female s/p fall off a step-stool while cleaning on 04/26/2016. Pt found to have and operative fracture to her left tibial plateau. She presents today for ORIF of her L tibial plateau.  Pt does have hx of GERD, HTN, recurrent migraines (improved over last several years). Pt does not have any other complaints other than L knee pain   Past Medical History:  Diagnosis Date  . Anemia    as a younger woman  . Arthritis    osteoarthritis, psoriatic arthritis, rheumatoid arthritis  . Family history of adverse reaction to anesthesia    mother had N/V  . GERD (gastroesophageal reflux disease)   . Headache    migraines  . Hypertension   . Mitral valve prolapse     Past Surgical History:  Procedure Laterality Date  . COLONOSCOPY    . TRIGGER FINGER RELEASE Left    thumb    Family History  Problem Relation Age of Onset  . Hypertension Mother   . Transient ischemic attack Mother   . Dementia Mother   . COPD Father    Social History:  reports that she has never smoked. She has never used smokeless tobacco. She reports that she does not drink alcohol or use drugs.  Allergies:  Allergies  Allergen Reactions  . Sulfur     UNSPECIFIED REACTION     Medications Prior to Admission  Medication Sig Dispense Refill  . aspirin-acetaminophen-caffeine (EXCEDRIN MIGRAINE) 250-250-65 MG tablet Take 1 tablet by mouth 2 (two) times daily as needed for headache or migraine.    . bisoprolol-hydrochlorothiazide (ZIAC) 5-6.25 MG tablet Take 1 tablet by mouth daily.    . Calcium Carbonate Antacid (ANTACID PO) Take 1 tablet by mouth daily as needed (acid reflux).    . hydroxychloroquine (PLAQUENIL) 200 MG tablet Take 200 mg by mouth 2 (two) times daily.    Marland Kitchen losartan (COZAAR) 50 MG tablet Take 50 mg by mouth daily.    . ranitidine (ZANTAC) 300 MG tablet Take 300 mg by mouth 2 (two) times daily.     Labs  pending   Review of Systems  Constitutional: Negative for chills and fever.  Respiratory: Negative for shortness of breath and wheezing.   Cardiovascular: Negative for chest pain and palpitations.  Gastrointestinal: Negative for nausea and vomiting.  Neurological: Negative for tingling and sensory change.   BP (!) 137/54   Pulse 71   Temp 98.5 F (36.9 C) (Oral)   Resp 18   Ht 4\' 11"  (1.499 m)   Wt 68 kg (150 lb)   SpO2 100%   BMI 30.30 kg/m   Physical Exam  Constitutional: She is oriented to person, place, and time. She appears well-developed and well-nourished.  HENT:  Head: Normocephalic and atraumatic.  Eyes: EOM are normal. Pupils are equal, round, and reactive to light.  Cardiovascular: Normal rate and regular rhythm.   Respiratory: Effort normal and breath sounds normal. No respiratory distress.  GI: Bowel sounds are normal.  Musculoskeletal:  Left Lower Extremity    Mild L knee effusion    Swelling very well controlled   Distal motor and sensory functions intact      DPN, SPN, TN sensation intact      EHL, FHL, AT, PT, peroneals, gastroc motor intact    Mild ankle swelling but nontender    Ext warm    + DP pulse  No DCT    Compartments are soft    Mild ecchymosis    Skin wrinkles easily with gentle compression to proximal tibia     Hip w/o pain with axial load or log roll    No other findings of note   Neurological: She is alert and oriented to person, place, and time.  Skin: Skin is warm and dry.  Psychiatric: She has a normal mood and affect. Her behavior is normal.    Imaging     CT and DG left knee   Bicondylar L tibial plateau fx with moderate depression of lateral plateau  Assessment/Plan  76 y/o female with L bicondylar tibial plateau fracuture  OR for ORIF L tibial plateau Admit post op for pain control and therapies Will be NWB x 8 weeks Unrestricted ROM postop  Hinged brace lovenox post op x 4 weeks Risks and benefits reviewed, pt  wishes to proceed   Jari Pigg, PA-C 05/07/2016, 8:36 AM

## 2016-05-08 LAB — BASIC METABOLIC PANEL
ANION GAP: 11 (ref 5–15)
BUN: 19 mg/dL (ref 6–20)
CALCIUM: 8.4 mg/dL — AB (ref 8.9–10.3)
CO2: 26 mmol/L (ref 22–32)
CREATININE: 0.68 mg/dL (ref 0.44–1.00)
Chloride: 99 mmol/L — ABNORMAL LOW (ref 101–111)
Glucose, Bld: 116 mg/dL — ABNORMAL HIGH (ref 65–99)
Potassium: 3.4 mmol/L — ABNORMAL LOW (ref 3.5–5.1)
SODIUM: 136 mmol/L (ref 135–145)

## 2016-05-08 LAB — CBC
HEMATOCRIT: 29 % — AB (ref 36.0–46.0)
Hemoglobin: 9.5 g/dL — ABNORMAL LOW (ref 12.0–15.0)
MCH: 30 pg (ref 26.0–34.0)
MCHC: 32.8 g/dL (ref 30.0–36.0)
MCV: 91.5 fL (ref 78.0–100.0)
PLATELETS: 347 10*3/uL (ref 150–400)
RBC: 3.17 MIL/uL — ABNORMAL LOW (ref 3.87–5.11)
RDW: 13.5 % (ref 11.5–15.5)
WBC: 11.5 10*3/uL — AB (ref 4.0–10.5)

## 2016-05-08 MED ORDER — LOSARTAN POTASSIUM 50 MG PO TABS
50.0000 mg | ORAL_TABLET | Freq: Every day | ORAL | Status: DC
  Administered 2016-05-08 – 2016-05-10 (×3): 50 mg via ORAL
  Filled 2016-05-08 (×3): qty 1

## 2016-05-08 MED ORDER — PANTOPRAZOLE SODIUM 40 MG PO TBEC
40.0000 mg | DELAYED_RELEASE_TABLET | Freq: Every day | ORAL | Status: DC
  Administered 2016-05-08 – 2016-05-10 (×3): 40 mg via ORAL
  Filled 2016-05-08 (×3): qty 1

## 2016-05-08 MED ORDER — BISOPROLOL-HYDROCHLOROTHIAZIDE 5-6.25 MG PO TABS
1.0000 | ORAL_TABLET | Freq: Every day | ORAL | Status: DC
  Administered 2016-05-08 – 2016-05-10 (×3): 1 via ORAL
  Filled 2016-05-08 (×3): qty 1

## 2016-05-08 NOTE — Progress Notes (Signed)
Patient ID: Joy Freeman, female   DOB: 03-02-1940, 76 y.o.   MRN: [id number]     Subjective:  Patient reports pain as mild.  Patient sitting up in bed and in no acute distress.  Patient denies CP or SOB.  Patient would like to get home ASAP  Objective:   VITALS:   Vitals:   05/07/16 1641 05/07/16 1644 05/07/16 2100 05/08/16 0700  BP: (!) 128/54  (!) 133/57 (!) 130/54  Pulse: 75  79 86  Resp: 16  18 20   Temp: 98 F (36.7 C)  99.3 F (37.4 C) 99.9 F (37.7 C)  TempSrc:   Oral Oral  SpO2: 100% 99% 92% 93%  Weight:      Height:        ABD soft Sensation intact distally Dorsiflexion/Plantar flexion intact Incision: dressing C/D/I and no drainage Good foot and ankle motion  Lab Results  Component Value Date   WBC 11.5 (H) 05/08/2016   HGB 9.5 (L) 05/08/2016   HCT 29.0 (L) 05/08/2016   MCV 91.5 05/08/2016   PLT 347 05/08/2016   BMET    Component Value Date/Time   NA 136 05/08/2016 0408   K 3.4 (L) 05/08/2016 0408   CL 99 (L) 05/08/2016 0408   CO2 26 05/08/2016 0408   GLUCOSE 116 (H) 05/08/2016 0408   BUN 19 05/08/2016 0408   CREATININE 0.68 05/08/2016 0408   CALCIUM 8.4 (L) 05/08/2016 0408   GFRNONAA >60 05/08/2016 0408   GFRAA >60 05/08/2016 0408     Assessment/Plan: 1 Day Post-Op   Active Problems:   Tibial plateau fracture   Advance diet Up with therapy Plan for discharge tomorrow or Monday NWB left lower ext Patient has not had PT and will need to have this prior to DC Plan dressing change tomorrow   Remonia Richter 05/08/2016, 9:06 AM  Agree with above.  Probable DC home tomorrow if passes PT.  Marchia Bond, MD Cell [phone]

## 2016-05-08 NOTE — Progress Notes (Signed)
Contacted Dona at [facility]) to discuss HHPT referral. Informed her that pt lives in [place], [place]. She will contact intake to see if they can take the referral. Received call from [facility] who reports that they can accept the referral.  Met with pt and she agreed with [facility] agency.

## 2016-05-08 NOTE — Evaluation (Signed)
Occupational Therapy Evaluation Patient Details Name: Joy Freeman MRN: [id number] DOB: Jul 21, 1940 Today's Date: 05/08/2016    History of Present Illness Pt admit for ORIF Left bicondylar tibial plateau, repair of lateral meniscus as she fell off stool 04/26/16.  Pt does have hx of GERD, HTN, recurrent migraines (improved over last several years).   Clinical Impression   Prior to fall 3/5, pt was independent with ADL and functional mobility. Since fall she has required min assist with RW for functional mobility and was taking sponge baths. Pt currently requires mod assist for LB ADL and min assist for stand-pivot toilet transfers. She would benefit from continued OT services to improve independence with ADL and functional mobility in preparation for D/C home with 24 hour assistance from family and [facility] services. OT will continue to follow acutely with focus on LB ADL and B UE strengthening in order to improve independence with standing ADL tasks utilizing RW.    Follow Up Recommendations  Home health OT;Supervision/Assistance - 24 hour    Equipment Recommendations  3 in 1 bedside commode;Wheelchair (measurements OT);Wheelchair cushion (measurements OT)    Recommendations for Other Services       Precautions / Restrictions Precautions Precautions: Fall;Knee Precaution Comments: No pillow under knee Required Braces or Orthoses: Other Brace/Splint Other Brace/Splint: LLE hinged brace Restrictions Weight Bearing Restrictions: Yes LLE Weight Bearing: Non weight bearing      Mobility Bed Mobility Overal bed mobility: Needs Assistance Bed Mobility: Supine to Sit     Supine to sit: Mod assist     General bed mobility comments: Mod assist to move L LE off of bed and to scoot hips to EOB.  Transfers Overall transfer level: Needs assistance Equipment used: Rolling walker (2 wheeled) Transfers: Sit to/from [facility] Sit to Stand: Min assist Stand pivot transfers:  Min assist       General transfer comment: Steadying assist for sit<>stand. VC's for hand placement.    Balance Overall balance assessment: Needs assistance Sitting-balance support: No upper extremity supported;Feet supported Sitting balance-Leahy Scale: Fair   Postural control: Right lateral lean Standing balance support: Bilateral upper extremity supported;During functional activity Standing balance-Leahy Scale: Poor Standing balance comment: Reliant on UE support during ADL tasks.                            ADL Overall ADL's : Needs assistance/impaired Eating/Feeding: Set up;Sitting   Grooming: Set up;Sitting   Upper Body Bathing: Set up;Supervision/ safety;Sitting   Lower Body Bathing: Moderate assistance;Sit to/from stand   Upper Body Dressing : Set up;Supervision/safety;Sitting   Lower Body Dressing: Moderate assistance;Sit to/from stand   Toilet Transfer: Minimal assistance;RW;Stand-pivot   Toileting- Clothing Manipulation and Hygiene: Minimal assistance;Sit to/from stand         General ADL Comments: Educated pt and family concerning use of AE for LB dressing, dressing techniques, and safe toilet transfers. Pt concerned about how to get into and out of shower when able and plan to review this further next session.     Vision Patient Visual Report: No change from baseline Vision Assessment?: No apparent visual deficits     Perception     Praxis      Pertinent Vitals/Pain Pain Assessment: Faces Faces Pain Scale: Hurts little more Pain Location: left LE Pain Descriptors / Indicators: Aching;Discomfort;Operative site guarding;Sore Pain Intervention(s): Limited activity within patient's tolerance;Repositioned     Hand Dominance Right   Extremity/Trunk Assessment Upper Extremity  Assessment Upper Extremity Assessment: Generalized weakness   Lower Extremity Assessment Lower Extremity Assessment: LLE deficits/detail LLE Deficits / Details:  Decreased strength as expected post-operatively LLE: Unable to fully assess due to pain   Cervical / Trunk Assessment Cervical / Trunk Assessment: Normal   Communication Communication Communication: No difficulties   Cognition Arousal/Alertness: Awake/alert Behavior During Therapy: Flat affect Overall Cognitive Status: Within Functional Limits for tasks assessed                     General Comments       Exercises       Shoulder Instructions      Home Living Family/patient expects to be discharged to:: Private residence Living Arrangements: Spouse/significant other Available Help at Discharge: Family;Available 24 hours/day Type of Home: House Home Access: Stairs to enter [employer] of Steps: 3 Entrance Stairs-Rails: Left Home Layout: One level     Bathroom Shower/Tub: Walk-in shower (with higher step over than typical)   Bathroom Toilet: Standard Bathroom Accessibility: Yes   Home Equipment: Walker - standard          Prior Functioning/Environment Level of Independence: Needs assistance  Gait / Transfers Assistance Needed: min assist for use of RW - husband was assisting  ADL's / Homemaking Assistance Needed: sponge bathing PTA since 3/5             OT Problem List: Decreased strength;Decreased activity tolerance;Impaired balance (sitting and/or standing);Decreased range of motion;Decreased safety awareness;Decreased knowledge of use of DME or AE;Decreased knowledge of precautions;Pain      OT Treatment/Interventions: Self-care/ADL training;Therapeutic exercise;Energy conservation;DME and/or AE instruction;Therapeutic activities;Patient/family education;Balance training    OT Goals(Current goals can be found in the care plan section) Acute Rehab OT Goals Patient Stated Goal: to go home OT Goal Formulation: With patient/family Time For Goal Achievement: 05/22/16 Potential to Achieve Goals: Good ADL Goals Pt Will Perform Lower Body  Bathing: with modified independence;with adaptive equipment;sitting/lateral leans Pt Will Perform Lower Body Dressing: with modified independence;with adaptive equipment;sitting/lateral leans Pt Will Transfer to Toilet: with supervision;ambulating;bedside commode Pt Will Perform Toileting - Clothing Manipulation and hygiene: with supervision;sitting/lateral leans Pt Will Perform Tub/Shower Transfer: Shower transfer;with min assist;3 in 1;rolling walker Pt/caregiver will Perform Home Exercise Program: Both right and left upper extremity;Increased strength;With theraband;With written HEP provided Additional ADL Goal #1: Pt will stand at sink to complete 2 consecutive grooming tasks in order to demonstrate improved activity tolerance for ADL.  OT Frequency: Min 2X/week   Barriers to D/C:            Co-evaluation              End of Session Equipment Utilized During Treatment: Gait belt;Rolling walker  Activity Tolerance: Patient tolerated treatment well Patient left: in chair;with call bell/phone within reach;with family/visitor present  OT Visit Diagnosis: Pain;Other abnormalities of gait and mobility (R26.89) Pain - Right/Left: Left Pain - part of body: Knee                ADL either performed or assessed with clinical judgement  Time: 8675-4492 OT Time Calculation (min): 25 min Charges:  OT General Charges $OT Visit: 1 Procedure OT Evaluation $OT Eval Moderate Complexity: 1 Procedure OT Treatments $Self Care/Home Management : 8-22 mins G-Codes:     Norman Herrlich, MS OTR/L  Pager: Susank A Bright Spielmann 05/08/2016, 4:49 PM

## 2016-05-08 NOTE — Evaluation (Signed)
Physical Therapy Evaluation Patient Details Name: Joy Freeman MRN: [id number] DOB: [birth year] Today's Date: 05/08/2016   History of Present Illness  Pt admit for ORIF Left bicondylar tibial plateau, repair of lateral meniscus as she fell off stool 04/26/16.  Pt does have hx of GERD, HTN, recurrent migraines (improved over last several years).  Clinical Impression  Pt admitted with above diagnosis. Pt currently with functional limitations due to the deficits listed below (see PT Problem List). Pt was able to stand and pivot to recliner with min assist with RW with ability to maintain NWB.  Needed steadying assist which she states her husband was able to provide.  May benefit from HHPT,HHOT, 3N1 and wheelchair for home use.   Pt will benefit from skilled PT to increase their independence and safety with mobility to allow discharge to the venue listed below.    Follow Up Recommendations Home health PT;Supervision/Assistance - 24 hour    Equipment Recommendations  3in1 (PT);Wheelchair (measurements PT);Wheelchair cushion (measurements PT) (18x16 lightweight w/c, desk arms, anti tippers, with elevating legrests,pressure relieving cushion )    Recommendations for Other Services       Precautions / Restrictions Precautions Precautions: Fall;Knee Precaution Booklet Issued: Yes (comment) Precaution Comments: No ROM limitations for left knee ROM per chart.  No pillows under left knee. Required Braces or Orthoses: Other Brace/Splint (left LE hinged brace) Restrictions Weight Bearing Restrictions: Yes LLE Weight Bearing: Non weight bearing      Mobility  Bed Mobility Overal bed mobility: Needs Assistance Bed Mobility: Supine to Sit     Supine to sit: Mod assist;+2 for physical assistance;HOB elevated     General bed mobility comments: Pt needed assist to move Left LE off bed as well as for elevation of trunk.  Used pad to assist pelvis to EOB.   Transfers Overall transfer level: Needs  assistance Equipment used: Rolling walker (2 wheeled) Transfers: Sit to/from [facility] Sit to Stand: Mod assist Stand pivot transfers: Min assist;+2 safety/equipment       General transfer comment: Pt needed cues for hand placement.  Pt needed steadying assist as well for sit to stand and stand pivot.  Pt able to hold left LE off floor and maintain NWB.    Ambulation/Gait                Stairs            Wheelchair Mobility    Modified Rankin (Stroke Patients Only)       Balance Overall balance assessment: Needs assistance Sitting-balance support: No upper extremity supported;Feet supported Sitting balance-Leahy Scale: Fair   Postural control: Posterior lean Standing balance support: Bilateral upper extremity supported;During functional activity Standing balance-Leahy Scale: Poor Standing balance comment: relies on RW for support.  Slight posterior lean needing steadying assist with transfer.                             Pertinent Vitals/Pain Pain Assessment: 0-10 Pain Score: 7  Pain Location: left LE Pain Descriptors / Indicators: Aching;Guarding;Grimacing;Operative site guarding;Sore Pain Intervention(s): Limited activity within patient's tolerance;Monitored during session;Repositioned;Patient requesting pain meds-RN notified   VSS Home Living Family/patient expects to be discharged to:: Private residence Living Arrangements: Spouse/significant other Available Help at Discharge: Family;Available 24 hours/day Type of Home: House Home Access: Stairs to enter   [employer] of Steps: 3 Home Layout: One level Home Equipment: Walker - 2 wheels  Prior Function Level of Independence: Needs assistance   Gait / Transfers Assistance Needed: min assist for use of RW - husband was assisting   ADL's / Homemaking Assistance Needed: sponge bathing PTA since 3/5         Hand Dominance        Extremity/Trunk  Assessment   Upper Extremity Assessment Upper Extremity Assessment: Defer to OT evaluation    Lower Extremity Assessment Lower Extremity Assessment: LLE deficits/detail LLE Deficits / Details: grossly 2-/5 LLE: Unable to fully assess due to pain    Cervical / Trunk Assessment Cervical / Trunk Assessment: Normal  Communication   Communication: No difficulties  Cognition Arousal/Alertness: Awake/alert Behavior During Therapy: Flat affect Overall Cognitive Status: Within Functional Limits for tasks assessed                      General Comments      Exercises General Exercises - Lower Extremity Ankle Circles/Pumps: AROM;Both;5 reps;Supine Quad Sets: AROM;Both;5 reps;Supine Heel Slides: AAROM;Both;10 reps;Supine Hip ABduction/ADduction: AAROM;Both;10 reps;Supine   Assessment/Plan    PT Assessment Patient needs continued PT services  PT Problem List Decreased strength;Decreased activity tolerance;Decreased balance;Decreased mobility;Decreased knowledge of use of DME;Decreased safety awareness;Decreased knowledge of precautions;Pain;Decreased range of motion       PT Treatment Interventions DME instruction;Gait training;Functional mobility training;Therapeutic activities;Therapeutic exercise;Balance training;Patient/family education    PT Goals (Current goals can be found in the Care Plan section)  Acute Rehab PT Goals Patient Stated Goal: to go home PT Goal Formulation: With patient Time For Goal Achievement: 05/22/16 Potential to Achieve Goals: Good    Frequency Min 6X/week   Barriers to discharge        Co-evaluation               End of Session Equipment Utilized During Treatment: Gait belt (Hinged brace left LE) Activity Tolerance: Patient tolerated treatment well Patient left: in chair;with call bell/phone within reach Nurse Communication: Mobility status PT Visit Diagnosis: Unsteadiness on feet (R26.81);Muscle weakness (generalized)  (M62.81);Pain Pain - Right/Left: Left Pain - part of body: Leg         Time: 1030-1048 PT Time Calculation (min) (ACUTE ONLY): 18 min   Charges:   PT Evaluation $PT Eval Moderate Complexity: 1 Procedure     PT G Codes:         Godfrey Pick Tania Steinhauser May 31, 2016, 12:54 PM  Harald Quevedo,PT Acute Rehabilitation [phone] (pager)

## 2016-05-09 LAB — BASIC METABOLIC PANEL
Anion gap: 7 (ref 5–15)
BUN: 15 mg/dL (ref 6–20)
CALCIUM: 8.4 mg/dL — AB (ref 8.9–10.3)
CO2: 27 mmol/L (ref 22–32)
Chloride: 101 mmol/L (ref 101–111)
Creatinine, Ser: 0.71 mg/dL (ref 0.44–1.00)
GFR calc Af Amer: >60 mL/min (ref >60)
Glucose, Bld: 104 mg/dL — ABNORMAL HIGH (ref 65–99)
POTASSIUM: 3.5 mmol/L (ref 3.5–5.1)
SODIUM: 135 mmol/L (ref 135–145)

## 2016-05-09 LAB — URINE CULTURE: CULTURE: NO GROWTH

## 2016-05-09 NOTE — Progress Notes (Signed)
Physical Therapy Treatment Patient Details Name: Joy Freeman MRN: [id number] DOB: 1940/08/31 Today's Date: 05/09/2016    History of Present Illness Pt admit for ORIF Left bicondylar tibial plateau, repair of lateral meniscus as she fell off stool 04/26/16.  Pt does have hx of GERD, HTN, recurrent migraines (improved over last several years).    PT Comments    Continuing work functional mobility and transfers, with noted progress with activity tolerance; able to maintain NWB LLE with shorter RW; Youth-sized RW more optimal for walking so that she does not have to lift walker up between taking steps; would like to get her a youth-sized RW in addition to the other equipment outlined below;   Will plan for stair training next session; I anticipate this will be difficult to keep NWB going up steps; We discussed the possibility to sit in a wheelchair and go up with 2-3 person assist, and pt tells me she does not think she and her husband can get the help; We may need to consider non-emergent ambulance transport home  Follow Up Recommendations  Home health PT;Supervision/Assistance - 24 hour     Equipment Recommendations  3in1 (PT);Wheelchair (measurements PT);Wheelchair cushion (measurements PT);  with 5" wheels (18x16 lightweight w/c, desk arms, anti tippers, elevate legs);  Rolling walker, Youth-sized;  May need non-emergent ambulance transport home.   Recommendations for Other Services       Precautions / Restrictions Precautions Precautions: Fall;Knee Precaution Comments: No pillow under knee Required Braces or Orthoses: Other Brace/Splint Other Brace/Splint: LLE hinged brace Restrictions LLE Weight Bearing: Non weight bearing    Mobility  Bed Mobility Overal bed mobility: Needs Assistance Bed Mobility: Supine to Sit     Supine to sit: Min assist     General bed mobility comments: Min assist to move L LE off of bed and to scoot hips to EOB.  Transfers Overall transfer  level: Needs assistance Equipment used: Rolling walker (2 wheeled) Transfers: Sit to/from Stand Sit to Stand: Min assist         General transfer comment: Steadying assist for sit<>stand. VC's for hand placement.  Ambulation/Gait Ambulation/Gait assistance: Min assist;+2 safety/equipment Ambulation Distance (Feet): 14 Feet Assistive device: Rolling walker (2 wheeled) Gait Pattern/deviations: Step-to pattern     General Gait Details: Cues to press body wight into RW and support self prior to hop-stepping to decr energy expenditure of amb; used youth-sized RW to also decr energy expenditure of walking, and pt stated the shorter RW was easier to manage   Stairs            Wheelchair Mobility    Modified Rankin (Stroke Patients Only)       Balance     Sitting balance-Leahy Scale: Good       Standing balance-Leahy Scale: Poor Standing balance comment: Reliant on UE support during ADL tasks.                    Cognition Arousal/Alertness: Awake/alert Behavior During Therapy: WFL for tasks assessed/performed Overall Cognitive Status: Within Functional Limits for tasks assessed                      Exercises General Exercises - Lower Extremity Ankle Circles/Pumps: AROM;Both;Supine;10 reps Quad Sets: AROM;Left;10 reps    General Comments General comments (skin integrity, edema, etc.): Took time to work on supporting self in [place] while simulating stepping up backwards; Difficulty due to UE weakness      Pertinent Vitals/Pain  Pain Assessment: 0-10 Pain Score: 3  Pain Location: left LE Pain Descriptors / Indicators: Aching;Burning Pain Intervention(s): Monitored during session    Home Living                      Prior Function            PT Goals (current goals can now be found in the care plan section) Acute Rehab PT Goals Patient Stated Goal: to go home PT Goal Formulation: With patient Time For Goal Achievement:  05/22/16 Potential to Achieve Goals: Good Progress towards PT goals: Progressing toward goals    Frequency    Min 6X/week      PT Plan Current plan remains appropriate    Co-evaluation             End of Session Equipment Utilized During Treatment: Gait belt (Hinged brace left LE) Activity Tolerance: Patient tolerated treatment well Patient left: in chair;with call bell/phone within reach Nurse Communication: Mobility status PT Visit Diagnosis: Unsteadiness on feet (R26.81);Muscle weakness (generalized) (M62.81);Pain Pain - Right/Left: Left Pain - part of body: Leg     Time: 9702-6378 PT Time Calculation (min) (ACUTE ONLY): 21 min  Charges:  $Gait Training: 8-22 mins                    G Codes:       [name] June 08, 2016, 2:29 PM  Roney Marion, [place] Pager [phone] Office [phone]

## 2016-05-09 NOTE — Progress Notes (Signed)
Occupational Therapy Treatment Patient Details Name: Joy Freeman MRN: [id number] DOB: 02-11-1941 Today's Date: 05/09/2016    History of present illness Pt admit for ORIF Left bicondylar tibial plateau, repair of lateral meniscus as she fell off stool 04/26/16.  Pt does have hx of GERD, HTN, recurrent migraines (improved over last several years).   OT comments  Pt. Was educated on use of ae for LE adls to increase I and safety. Pt. Was able to return demo at min/mod a level. Pt. Was educated on safe transfer to 3-1 commode for toileting at [place] a level. Pt. And therapist discussed use of 3-1 commode vs shower seat to increase ability to shower. Pt. Does not think either will fit in shower. Pt. States she will take sink bath.   Follow Up Recommendations  Home health OT    Equipment Recommendations  3 in 1 bedside commode    Recommendations for Other Services      Precautions / Restrictions Precautions Precautions: Fall;Knee Precaution Comments: No pillow under knee Required Braces or Orthoses: Other Brace/Splint Other Brace/Splint: LLE hinged brace Restrictions LLE Weight Bearing: Non weight bearing       Mobility Bed Mobility                  Transfers       Sit to Stand: Min assist Stand pivot transfers: Min assist            Balance                                   ADL                       Lower Body Dressing: Minimal assistance;Moderate assistance   Toilet Transfer: Minimal assistance   Toileting- Clothing Manipulation and Hygiene: Minimal assistance         General ADL Comments: Educated pt and family concerning use of AE for LB dressing, dressing techniques, and safe toilet transfers. Discussed options for 3-1 in shower vs. shower seat. pnt. concerned that her shower is too small. Pt. states she will take sink bath for the time being.      Vision                     Perception     Praxis      Cognition    Behavior During Therapy: WFL for tasks assessed/performed Overall Cognitive Status: Within Functional Limits for tasks assessed                         Exercises     Shoulder Instructions       General Comments      Pertinent Vitals/ Pain       Pain Assessment: 0-10 Pain Score: 3  Pain Location:  (L LE) Pain Descriptors / Indicators: Aching;Burning Pain Intervention(s): Limited activity within patient's tolerance  Home Living                                          Prior Functioning/Environment              Frequency           Progress Toward Goals  OT Goals(current goals can now be found in  the care plan section)     Acute Rehab OT Goals Patient Stated Goal: to go home OT Goal Formulation: With patient  Plan      Co-evaluation                 End of Session Equipment Utilized During Treatment: Gait belt;Rolling walker  OT Visit Diagnosis: Unsteadiness on feet (R26.81);Muscle weakness (generalized) (M62.81);History of falling (Z91.81) Pain - Right/Left: Left Pain - part of body: Knee   Activity Tolerance Patient tolerated treatment well   Patient Left in chair;with call bell/phone within reach   Nurse Communication Patient requests pain meds        Time: 1000-1040 OT Time Calculation (min): 40 min  Charges: OT General Charges $OT Visit: 1 Procedure OT Treatments $Self Care/Home Management : 27-51 mins  Click score 19, clinical judgement  Helyne Genther 05/09/2016, 10:40 AM

## 2016-05-09 NOTE — Progress Notes (Signed)
Patient ID: Joy Freeman, female   DOB: 1940-08-26, 76 y.o.   MRN: [id number]     Subjective:  Patient reports pain as mild to moderate.  Patient denies any CP or SOB.  Patient is slow to mobilize  Objective:   VITALS:   Vitals:   05/08/16 0700 05/08/16 1351 05/08/16 2117 05/09/16 0600  BP: (!) 130/54 (!) 132/45 (!) 144/56 128/60  Pulse: 86 71 83 84  Resp: 20 18 18 17   Temp: 99.9 F (37.7 C) 98.7 F (37.1 C) 99 F (37.2 C) 99.2 F (37.3 C)  TempSrc: Oral Oral Oral Oral  SpO2: 93% 95% 96% 98%  Weight:      Height:        ABD soft Sensation intact distally Dorsiflexion/Plantar flexion intact Incision: dressing C/D/I and no drainage Dressing changed and wounds are good  Lab Results  Component Value Date   WBC 11.5 (H) 05/08/2016   HGB 9.5 (L) 05/08/2016   HCT 29.0 (L) 05/08/2016   MCV 91.5 05/08/2016   PLT 347 05/08/2016   BMET    Component Value Date/Time   NA 135 05/09/2016 0639   K 3.5 05/09/2016 0639   CL 101 05/09/2016 0639   CO2 27 05/09/2016 0639   GLUCOSE 104 (H) 05/09/2016 0639   BUN 15 05/09/2016 0639   CREATININE 0.71 05/09/2016 0639   CALCIUM 8.4 (L) 05/09/2016 0639   GFRNONAA >60 05/09/2016 0639   GFRAA >60 05/09/2016 4944     Assessment/Plan: 2 Days Post-Op   Active Problems:   Tibial plateau fracture   Advance diet Up with therapy Plan for discharge tomorrow  Patient slow to mobilize should continue with PT NWB left lower ext Bledsoe brace at all times Dry dressing PRN   DOUGLAS PARRY, BRANDON 05/09/2016, 11:11 AM  Discussed and agree with above.   Marchia Bond, MD Cell [phone]

## 2016-05-10 ENCOUNTER — Encounter (HOSPITAL_COMMUNITY): Payer: Self-pay | Admitting: Orthopedic Surgery

## 2016-05-10 DIAGNOSIS — Z8669 Personal history of other diseases of the nervous system and sense organs: Secondary | ICD-10-CM

## 2016-05-10 DIAGNOSIS — I10 Essential (primary) hypertension: Secondary | ICD-10-CM | POA: Diagnosis present

## 2016-05-10 DIAGNOSIS — K219 Gastro-esophageal reflux disease without esophagitis: Secondary | ICD-10-CM | POA: Diagnosis present

## 2016-05-10 HISTORY — DX: Personal history of other diseases of the nervous system and sense organs: Z86.69

## 2016-05-10 LAB — BASIC METABOLIC PANEL
ANION GAP: 8 (ref 5–15)
BUN: 15 mg/dL (ref 6–20)
CALCIUM: 8.6 mg/dL — AB (ref 8.9–10.3)
CO2: 29 mmol/L (ref 22–32)
Chloride: 100 mmol/L — ABNORMAL LOW (ref 101–111)
Creatinine, Ser: 0.72 mg/dL (ref 0.44–1.00)
GFR calc Af Amer: >60 mL/min (ref >60)
GLUCOSE: 112 mg/dL — AB (ref 65–99)
Potassium: 3.4 mmol/L — ABNORMAL LOW (ref 3.5–5.1)
SODIUM: 137 mmol/L (ref 135–145)

## 2016-05-10 LAB — VITAMIN D 25 HYDROXY (VIT D DEFICIENCY, FRACTURES): VIT D 25 HYDROXY: 16.9 ng/mL — AB (ref 30.0–100.0)

## 2016-05-10 MED ORDER — ENOXAPARIN SODIUM 40 MG/0.4ML ~~LOC~~ SOLN: 40.0000 mg | SUBCUTANEOUS | 0 refills | Status: AC

## 2016-05-10 MED ORDER — ENOXAPARIN (LOVENOX) PATIENT EDUCATION KIT
PACK | Freq: Once | Status: DC
  Filled 2016-05-10: qty 1

## 2016-05-10 MED ORDER — LOSARTAN POTASSIUM 50 MG PO TABS: 50.0000 mg | ORAL_TABLET | Freq: Every day | ORAL | Status: DC

## 2016-05-10 MED ORDER — BISOPROLOL-HYDROCHLOROTHIAZIDE 5-6.25 MG PO TABS: 1.0000 | ORAL_TABLET | Freq: Every day | ORAL | Status: DC

## 2016-05-10 MED ORDER — DOCUSATE SODIUM 100 MG PO CAPS: 100.0000 mg | ORAL_CAPSULE | Freq: Two times a day (BID) | ORAL | 0 refills | Status: AC

## 2016-05-10 MED ORDER — METHOCARBAMOL 500 MG PO TABS: 500.0000 mg | ORAL_TABLET | Freq: Four times a day (QID) | ORAL | 0 refills | Status: AC | PRN

## 2016-05-10 MED ORDER — HYDROCODONE-ACETAMINOPHEN 5-325 MG PO TABS: 1.0000 | ORAL_TABLET | Freq: Four times a day (QID) | ORAL | 0 refills | Status: AC | PRN

## 2016-05-10 NOTE — Progress Notes (Signed)
Pt ready for discharge from unit to home. DME delivered to room, W/C, BSC, Tobi Bastos. Family will transport to home.

## 2016-05-10 NOTE — Progress Notes (Signed)
Orthopedic Trauma Service Progress Note    Subjective:  Patient reports pain as mild.    Pain control very good Ready to go home today No complaints  Voiding w/o difficulty + flatus Appetite good   Review of Systems  Constitutional: Negative for chills and fever.  Respiratory: Negative for shortness of breath.   Cardiovascular: Negative for chest pain and palpitations.  Gastrointestinal: Negative for abdominal pain, nausea and vomiting.  Neurological: Negative for tingling.    Objective:   VITALS:   Vitals:   05/09/16 0600 05/09/16 1500 05/09/16 2211 05/10/16 0315  BP: 128/60 (!) 103/54 (!) 130/52 (!) 142/51  Pulse: 84 79 85 92  Resp: 17 17 16 18   Temp: 99.2 F (37.3 C) 98.4 F (36.9 C) 99.8 F (37.7 C) 98.9 F (37.2 C)  TempSrc: Oral Oral Oral Oral  SpO2: 98% 98% 97% 98%  Weight:      Height:        Intake/Output      03/18 0701 - 03/19 0700 03/19 0701 - 03/20 0700   P.O. 960 240   Total Intake(mL/kg) 960 (14.1) 240 (3.5)   Net +960 +240        Urine Occurrence 5 x 1 x     LABS  Results for orders placed or performed during the hospital encounter of 05/07/16 (from the past 24 hour(s))  Basic metabolic panel     Status: Abnormal   Collection Time: 05/10/16  4:45 AM  Result Value Ref Range   Sodium 137 135 - 145 mmol/L   Potassium 3.4 (L) 3.5 - 5.1 mmol/L   Chloride 100 (L) 101 - 111 mmol/L   CO2 29 22 - 32 mmol/L   Glucose, Bld 112 (H) 65 - 99 mg/dL   BUN 15 6 - 20 mg/dL   Creatinine, Ser 0.72 0.44 - 1.00 mg/dL   Calcium 8.6 (L) 8.9 - 10.3 mg/dL   GFR calc non Af Amer >60 >60 mL/min   GFR calc Af Amer >60 >60 mL/min   Anion gap 8 5 - 15     PHYSICAL EXAM:   Gen: resting comfortably in bed, NAD Lungs: clear B  Cardiac: RRR, s1 and s2  Abd:+ BS, NTND Ext:       Left Lower Extremity   Wounds look fantastic   No drainage  Swelling well controlled  Ext warm  Pt getting full extension of knee already   Hinged knee  brace fitting well  DPN, SPN, TN sensation intact  EHL, FHL, AT, PT, peroneals, gastroc motor intact  No DCT   No pain with passive stretch   Assessment/Plan: 3 Days Post-Op   Principal Problem:   Tibial plateau fracture Active Problems:   Hypertension   GERD (gastroesophageal reflux disease)   Anti-infectives    Start     Dose/Rate Route Frequency Ordered Stop   05/07/16 2200  hydroxychloroquine (PLAQUENIL) tablet 200 mg     200 mg Oral 2 times daily 05/07/16 1554     05/07/16 1900  ceFAZolin (ANCEF) IVPB 1 g/50 mL premix     1 g 100 mL/hr over 30 Minutes Intravenous Every 6 hours 05/07/16 1554 05/08/16 0957   05/07/16 0845  ceFAZolin (ANCEF) IVPB 2g/100 mL premix     2 g 200 mL/hr over 30 Minutes Intravenous On call to O.R. 05/07/16 1610 05/07/16 1136    .  POD/HD#: 68  76 y/o female s/p fall off step stool with L bicondylar tibial plateau fracture   -  L bicondylar tibial plateau fracture  NWB x 8 weeks  Unrestricted ROM L knee   Work on getting and maintaining full knee extension    Unrestricted knee flexion    Brace can be off when in bed   No pillows under knee at rest, use bone foam or place pillows under ankle   Dressing changes as needed  TED hose   - Pain management:  Continue with current regimen   - ABL anemia/Hemodynamics  H/H stable  BP slightly elevated   Restart home meds  - Medical issues   As above   - DVT/PE prophylaxis:  lovenox x 4 weeks  - ID:   periop abx completed   - Activity:  NWB L leg o/w as tolerated  - FEN/GI prophylaxis/Foley/Lines:  Reg diet   Dc IVF and IV   - Dispo:  Dc home today   Follow up with ortho in 2 weeks   Arrange for DME     Jari Pigg, PA-C [facility] [phone] (P) [phone] (O) 05/10/2016, 10:11 AM

## 2016-05-10 NOTE — Progress Notes (Signed)
Patient ready for discharge. Discharge instructions reviewed with patient and family with teach back. Patient's husband and daughter are taking her home. Patient and family verbalized understanding of discharge instructions. Patient is not in distress.

## 2016-05-10 NOTE — Progress Notes (Signed)
Physical Therapy Treatment Patient Details Name: Joy Freeman MRN: [id number] DOB: 1940-05-10 Today's Date: 05/10/2016    History of Present Illness Pt admit for ORIF Left bicondylar tibial plateau, repair of lateral meniscus as she fell off stool 04/26/16.  Pt does have hx of GERD, HTN, recurrent migraines (improved over last several years).    PT Comments    Pt daughter present in room and able to educate her on proper technique for ascending/descending stairs in the wheelchair and which steps into the house would be most appropriate. Also discussed the possibility of building or renting a ramp to aid in entrance and exit from the house when she is not available to help pt's husband. Due to the imminent d/c unable to practice with patient.     Follow Up Recommendations  Home health PT;Supervision/Assistance - 24 hour     Equipment Recommendations  3in1 (PT);Wheelchair (measurements PT);Wheelchair cushion (measurements PT);Rolling walker with 5" wheels (18x16 lightweight w/c, desk arms, anti tippers, elevate legs)       Precautions / Restrictions Precautions Precautions: Fall;Knee Precaution Comments: No pillow under knee Required Braces or Orthoses: Other Brace/Splint Other Brace/Splint: LLE hinged brace Restrictions Weight Bearing Restrictions: Yes LLE Weight Bearing: Non weight bearing    Mobility  Bed Mobility Overal bed mobility: Needs Assistance Bed Mobility: Supine to Sit     Supine to sit: Min assist     General bed mobility comments: Min assist to move L LE off of bed and to scoot hips to EOB.  Transfers Overall transfer level: Needs assistance Equipment used: Rolling walker (2 wheeled) Transfers: Sit to/from Stand Sit to Stand: Min assist         General transfer comment: vc for hand placement on bed for push off, minA for stabilization once upright  Ambulation/Gait Ambulation/Gait assistance: Min assist Ambulation Distance (Feet): 20 Feet Assistive  device: Rolling walker (2 wheeled) Gait Pattern/deviations: Step-to pattern;Shuffle Gait velocity: slow Gait velocity interpretation: Below normal speed for age/gender General Gait Details: decreased foot clearance with R LE cues to use UE on RW to decrease weight through R LE   Stairs Stairs: Yes       General stair comments: Pt getting ready for d/c, pt's daughter educated on proper technique for ascend/descend of stairs with W/C      Balance     Sitting balance-Leahy Scale: Good       Standing balance-Leahy Scale: Poor Standing balance comment: requires UE support on RW for balance.                    Cognition Arousal/Alertness: Awake/alert Behavior During Therapy: WFL for tasks assessed/performed Overall Cognitive Status: Within Functional Limits for tasks assessed                      Exercises Total Joint Exercises Long Arc Quad: AROM;10 reps;Seated Knee Flexion: AROM;Left;10 reps;Seated General Exercises - Lower Extremity Ankle Circles/Pumps: AROM;Both;Supine;10 reps Quad Sets: AROM;Left;10 reps Heel Slides: AAROM;5 reps;Left    General Comments        Pertinent Vitals/Pain Pain Assessment: 0-10 Pain Score: 2  Pain Location: left LE Pain Descriptors / Indicators: Aching;Burning           PT Goals (current goals can now be found in the care plan section) Acute Rehab PT Goals Patient Stated Goal: to go home PT Goal Formulation: With patient Time For Goal Achievement: 05/22/16 Potential to Achieve Goals: Good Progress towards PT goals: Progressing toward  goals    Frequency    Min 6X/week      PT Plan Current plan remains appropriate    Co-evaluation             End of Session Equipment Utilized During Treatment:  (Hinged brace left LE) Activity Tolerance: Patient tolerated treatment well Patient left: Other (comment);with family/visitor present (in wheelchair ready for d/c) Nurse Communication: Mobility status PT  Visit Diagnosis: Unsteadiness on feet (R26.81);Muscle weakness (generalized) (M62.81);Pain Pain - Right/Left: Left Pain - part of body: Leg     Time: 7494-4967 PT Time Calculation (min) (ACUTE ONLY): 13 min  Charges:  $Gait Training: 8-22 mins $Therapeutic Exercise: 8-22 mins                    G Codes:       Bailey Mech Fleet 05/10/2016, 3:39 PM  Benjamine Mola B. Migdalia Dk PT, DPT Acute Rehabilitation  [phone] Pager [phone]

## 2016-05-10 NOTE — Care Management Note (Addendum)
Case Management Note  Patient Details  Name: Joy Freeman MRN: [id number] Date of Birth: [birth year]  Subjective/Objective: Pt presented for ORIF Left bicondylar tibial plateau, repair of lateral meniscus. Pt is from home with support of husband and daughter. Previous CM had set up with [facility] did reach out to [facility] this am and office will not be able to see patient until 05-17-16.                           Action/Plan: CM did cancel [place]. Pt was set up over the weekend with [facility]- able to take pt per [facility] DME ordered via [facility] for delivery to room before family arrives at 64. Pt will transport home via Private vehicle. No further needs from CM at this time.   Expected Discharge Date:  05/10/16               Expected Discharge Plan:  [place]  In-House Referral:  NA  Discharge planning Services  CM Consult  Post Acute Care Choice:  Home Health Choice offered to:  Patient  DME Arranged:  3-N-1, Walker youth, Walker rolling, Programmer, multimedia DME Agency:  [place]:  PT, OT [place] Agency:  [facility] [employer] ([facility])  Status of Service:  Completed, signed off  If discussed at [employer], dates discussed:    Additional Comments: [address] Jacqlyn Krauss, RN,BSN [phone] CM did speak with pt  In regards to making it up the steps for home. Per therapist she has 3 steps to get into home. CM did discuss with patients daughter and she is purchasing a gait belt to assist with getting pt into home. Per family they feel they can get patient inside. CM did offer ambulance transport home. Family feels not needed. Family can contact CM if ambulance transport needed.  Bethena Roys, RN 05/10/2016, 11:05 AM

## 2016-05-10 NOTE — Discharge Summary (Signed)
Orthopaedic Trauma Service (OTS)  Patient ID: Joy Freeman MRN: [id number] DOB/AGE: [birth year] 76 y.o.  Admit date: 05/07/2016 Discharge date: 05/10/2016  Admission Diagnoses: Left bicondylar tibial plateau fracture  HTN GERD h/o migraines   Discharge Diagnoses:  Principal Problem:   Left Tibial plateau fracture Active Problems:   Hypertension   GERD (gastroesophageal reflux disease)   History of migraine   Procedures Performed: 05/07/2016- Dr. Marcelino Scot 1. Open reduction and internal fixation of left bicondylar tibial     plateau. 2. Repair of left lateral meniscus with an arthrotomy.     Discharged Condition: good  Hospital Course:   76 year old white female who sustained a low energy fall off of a step stool about 2 weeks ago while at home. She was referred to our office for repair of her left tibial plateau fracture. Patient was seen and evaluated in the office and was determined that surgical intervention would be in the patient's best interest. She was taken to the operating room on 05/07/2016 with the procedures noted above were performed. Patient tolerated this procedure well. After surgery she was transferred to the PACU for recovery from anesthesia and transferred back up to the orthopedic floor for observation and pain control as well as to work with therapies. Patient's hospital stay was really unremarkable she progressed slowly initially but then improved on postoperative day #2 and #3. By postoperative day #3 patient's pain was adequately controlled. She was working well with therapy and tolerating a regular diet and was deemed to be stable for discharge to home. Patient was started on Lovenox for DVT and PE prophylaxis on postoperative day #1. She was covered with a routine antibiotic prophylaxis following surgery. All necessary DME was ordered for the patient as well, including a hinged knee brace, wheelchair and bedside commode  Consults: None  Significant  Diagnostic Studies: labs:   Results for JAMY, WHYTE (MRN [id number]) as of 05/10/2016 11:01  Ref. Range 05/10/2016 04:45  Sodium Latest Ref Range: 135 - 145 mmol/L 137  Potassium Latest Ref Range: 3.5 - 5.1 mmol/L 3.4 (L)  Chloride Latest Ref Range: 101 - 111 mmol/L 100 (L)  CO2 Latest Ref Range: 22 - 32 mmol/L 29  Glucose Latest Ref Range: 65 - 99 mg/dL 112 (H)  BUN Latest Ref Range: 6 - 20 mg/dL 15  Creatinine Latest Ref Range: 0.44 - 1.00 mg/dL 0.72  Calcium Latest Ref Range: 8.9 - 10.3 mg/dL 8.6 (L)  Anion gap Latest Ref Range: 5 - 15  8  EGFR (African American) Latest Ref Range: >60 mL/min >60  EGFR (Non-African Amer.) Latest Ref Range: >60 mL/min >60    Treatments: IV hydration, antibiotics: Ancef, analgesia: Norco and morphine anticoagulation: LMW heparin, therapies: PT, OT, RN  and surgery: As above  Discharge Exam:  Orthopedic Trauma Service Progress Note      Subjective:   Patient reports pain as mild.     Pain control very good Ready to go home today No complaints   Voiding w/o difficulty + flatus Appetite good     Review of Systems  Constitutional: Negative for chills and fever.  Respiratory: Negative for shortness of breath.   Cardiovascular: Negative for chest pain and palpitations.  Gastrointestinal: Negative for abdominal pain, nausea and vomiting.  Neurological: Negative for tingling.      Objective:    VITALS:         Vitals:    05/09/16 0600 05/09/16 1500 05/09/16 2211 05/10/16 0315  BP: 128/60 (!) 103/54 Marland Kitchen)  130/52 (!) 142/51  Pulse: 84 79 85 92  Resp: 17 17 16 18   Temp: 99.2 F (37.3 C) 98.4 F (36.9 C) 99.8 F (37.7 C) 98.9 F (37.2 C)  TempSrc: Oral Oral Oral Oral  SpO2: 98% 98% 97% 98%  Weight:          Height:              Intake/Output      03/18 0701 - 03/19 0700 03/19 0701 - 03/20 0700   P.O. 960 240   Total Intake(mL/kg) 960 (14.1) 240 (3.5)   Net +960 +240        Urine Occurrence 5 x 1 x      LABS   Lab Results  Last 24 Hours       Results for orders placed or performed during the hospital encounter of 05/07/16 (from the past 24 hour(s))  Basic metabolic panel     Status: Abnormal    Collection Time: 05/10/16  4:45 AM  Result Value Ref Range    Sodium 137 135 - 145 mmol/L    Potassium 3.4 (L) 3.5 - 5.1 mmol/L    Chloride 100 (L) 101 - 111 mmol/L    CO2 29 22 - 32 mmol/L    Glucose, Bld 112 (H) 65 - 99 mg/dL    BUN 15 6 - 20 mg/dL    Creatinine, Ser 0.72 0.44 - 1.00 mg/dL    Calcium 8.6 (L) 8.9 - 10.3 mg/dL    GFR calc non Af Amer >60 >60 mL/min    GFR calc Af Amer >60 >60 mL/min    Anion gap 8 5 - 15          PHYSICAL EXAM:    Gen: resting comfortably in bed, NAD Lungs: clear B  Cardiac: RRR, s1 and s2  Abd:+ BS, NTND Ext:       Left Lower Extremity              Wounds look fantastic              No drainage             Swelling well controlled             Ext warm             Pt getting full extension of knee already              Hinged knee brace fitting well             DPN, SPN, TN sensation intact             EHL, FHL, AT, PT, peroneals, gastroc motor intact             No DCT              No pain with passive stretch    Assessment/Plan: 3 Days Post-Op    Principal Problem:   Tibial plateau fracture Active Problems:   Hypertension   GERD (gastroesophageal reflux disease)               Anti-infectives     Start     Dose/Rate Route Frequency Ordered Stop    05/07/16 2200   hydroxychloroquine (PLAQUENIL) tablet 200 mg     200 mg Oral 2 times daily 05/07/16 1554      05/07/16 1900   ceFAZolin (ANCEF) IVPB 1 g/50 mL premix     1 g 100 mL/hr  over 30 Minutes Intravenous Every 6 hours 05/07/16 1554 05/08/16 0957    05/07/16 0845   ceFAZolin (ANCEF) IVPB 2g/100 mL premix     2 g 200 mL/hr over 30 Minutes Intravenous On call to O.R. 05/07/16 6812 05/07/16 1136     .   POD/HD#: 59   76 y/o female s/p fall off step stool with L bicondylar tibial plateau fracture     -L bicondylar tibial plateau fracture             NWB x 8 weeks             Unrestricted ROM L knee                         Work on getting and maintaining full knee extension                          Unrestricted knee flexion                          Brace can be off when in bed                         No pillows under knee at rest, use bone foam or place pillows under ankle              Dressing changes as needed             TED hose    - Pain management:             Continue with current regimen              - ABL anemia/Hemodynamics             H/H stable             BP slightly elevated                         Restart home meds   - Medical issues              As above    - DVT/PE prophylaxis:             lovenox x 4 weeks   - ID:              periop abx completed     - Activity:             NWB L leg o/w as tolerated   - FEN/GI prophylaxis/Foley/Lines:             Reg diet              Dc IVF and IV     - Dispo:             Dc home today              Follow up with ortho in 2 weeks              Arrange for DME     Disposition: Home with home health  Discharge Instructions    Call MD / Call 911    Complete by:  As directed    If you experience chest pain or shortness of breath, CALL 911 and be transported to the hospital emergency room.  If you develope a fever  above 101 F, pus (white drainage) or increased drainage or redness at the wound, or calf pain, call your surgeon's office.   Constipation Prevention    Complete by:  As directed    Drink plenty of fluids.  Prune juice may be helpful.  You may use a stool softener, such as Colace (over the counter) 100 mg twice a day.  Use MiraLax (over the counter) for constipation as needed.   Diet - low sodium heart healthy    Complete by:  As directed    Discharge instructions    Complete by:  As directed    Orthopaedic Trauma Service Discharge Instructions   General Discharge Instructions  WEIGHT BEARING  STATUS: nonweightbearing left lower extremity   RANGE OF MOTION/ACTIVITY:unrestricted range of motion left knee  Do not put a pillow under your knee when at rest. Place pillow under ankle or use bone foam to elevate leg. Ok to leave brace off when in bed. Put brace on when up and moving around   Wound Care: daily wound care starting on 05/12/2016. See instructions below Discharge Wound Care Instructions  Do NOT apply any ointments, solutions or lotions to pin sites or surgical wounds.  These prevent needed drainage and even though solutions like hydrogen peroxide kill bacteria, they also damage cells lining the pin sites that help fight infection.  Applying lotions or ointments can keep the wounds moist and can cause them to breakdown and open up as well. This can increase the risk for infection. When in doubt call the office.  Surgical incisions should be dressed daily.  If any drainage is noted, use one layer of adaptic, then gauze, Kerlix, and an ace wrap.  Once the incision is completely dry and without drainage, it may be left open to air out.  Showering may begin 36-48 hours later.  Cleaning gently with soap and water.  Traumatic wounds should be dressed daily as well.    One layer of adaptic, gauze, Kerlix, then ace wrap.  The adaptic can be discontinued once the draining has ceased    If you have a wet to dry dressing: wet the gauze with saline the squeeze as much saline out so the gauze is moist (not soaking wet), place moistened gauze over wound, then place a dry gauze over the moist one, followed by Kerlix wrap, then ace wrap.   PAIN MEDICATION USE AND EXPECTATIONS  You have likely been given narcotic medications to help control your pain.  After a traumatic event that results in an fracture (broken bone) with or without surgery, it is ok to use narcotic pain medications to help control one's pain.  We understand that everyone responds to pain differently and each individual patient  will be evaluated on a regular basis for the continued need for narcotic medications. Ideally, narcotic medication use should last no more than 6-8 weeks (coinciding with fracture healing).   As a patient it is your responsibility as well to monitor narcotic medication use and report the amount and frequency you use these medications when you come to your office visit.   We would also advise that if you are using narcotic medications, you should take a dose prior to therapy to maximize you participation.  IF YOU ARE ON NARCOTIC MEDICATIONS IT IS NOT PERMISSIBLE TO OPERATE A MOTOR VEHICLE (MOTORCYCLE/CAR/TRUCK/MOPED) OR HEAVY MACHINERY DO NOT MIX NARCOTICS WITH OTHER CNS (CENTRAL NERVOUS SYSTEM) DEPRESSANTS SUCH AS ALCOHOL  Diet: as you were eating previously.  Can use  over the counter stool softeners and bowel preparations, such as Miralax, to help with bowel movements.  Narcotics can be constipating.  Be sure to drink plenty of fluids    STOP SMOKING OR USING NICOTINE PRODUCTS!!!!  As discussed nicotine severely impairs your body's ability to heal surgical and traumatic wounds but also impairs bone healing.  Wounds and bone heal by forming microscopic blood vessels (angiogenesis) and nicotine is a vasoconstrictor (essentially, shrinks blood vessels).  Therefore, if vasoconstriction occurs to these microscopic blood vessels they essentially disappear and are unable to deliver necessary nutrients to the healing tissue.  This is one modifiable factor that you can do to dramatically increase your chances of healing your injury.    (This means no smoking, no nicotine gum, patches, etc)  DO NOT USE NONSTEROIDAL ANTI-INFLAMMATORY DRUGS (NSAID'S)  Using products such as Advil (ibuprofen), Aleve (naproxen), Motrin (ibuprofen) for additional pain control during fracture healing can delay and/or prevent the healing response.  If you would like to take over the counter (OTC) medication, Tylenol (acetaminophen)  is ok.  However, some narcotic medications that are given for pain control contain acetaminophen as well. Therefore, you should not exceed more than 4000 mg of tylenol in a day if you do not have liver disease.  Also note that there are may OTC medicines, such as cold medicines and allergy medicines that my contain tylenol as well.  If you have any questions about medications and/or interactions please ask your doctor/PA or your pharmacist.      ICE AND ELEVATE INJURED/OPERATIVE EXTREMITY  Using ice and elevating the injured extremity above your heart can help with swelling and pain control.  Icing in a pulsatile fashion, such as 20 minutes on and 20 minutes off, can be followed.    Do not place ice directly on skin. Make sure there is a barrier between to skin and the ice pack.    Using frozen items such as frozen peas works well as the conform nicely to the are that needs to be iced.  USE AN ACE WRAP OR TED HOSE FOR SWELLING CONTROL  In addition to icing and elevation, Ace wraps or TED hose are used to help limit and resolve swelling.  It is recommended to use Ace wraps or TED hose until you are informed to stop.    When using Ace Wraps start the wrapping distally (farthest away from the body) and wrap proximally (closer to the body)   Example: If you had surgery on your leg or thing and you do not have a splint on, start the ace wrap at the toes and work your way up to the thigh        If you had surgery on your upper extremity and do not have a splint on, start the ace wrap at your fingers and work your way up to the upper arm  IF YOU ARE IN A SPLINT OR CAST DO NOT [place]   If your splint gets wet for any reason please contact the office immediately. You may shower in your splint or cast as long as you keep it dry.  This can be done by wrapping in a cast cover or garbage back (or similar)  Do Not stick any thing down your splint or cast such as pencils, money, or hangers to try  and scratch yourself with.  If you feel itchy take benadryl as prescribed on the bottle for itching  IF YOU ARE IN  A CAM BOOT (BLACK BOOT)  You may remove boot periodically. Perform daily dressing changes as noted below.  Wash the liner of the boot regularly and wear a sock when wearing the boot. It is recommended that you sleep in the boot until told otherwise  CALL THE OFFICE WITH ANY QUESTIONS OR CONCERNS: [phone]   Driving restrictions    Complete by:  As directed    No driving   Increase activity slowly as tolerated    Complete by:  As directed    Non weight bearing    Complete by:  As directed    Laterality:  left   Extremity:  Lower     Allergies as of 05/10/2016      Reactions   Sulfur    UNSPECIFIED REACTION       Medication List    TAKE these medications   ANTACID PO Take 1 tablet by mouth daily as needed (acid reflux).   aspirin-acetaminophen-caffeine 250-250-65 MG tablet Commonly known as:  EXCEDRIN MIGRAINE Take 1 tablet by mouth 2 (two) times daily as needed for headache or migraine.   bisoprolol-hydrochlorothiazide 5-6.25 MG tablet Commonly known as:  ZIAC Take 1 tablet by mouth daily.   docusate sodium 100 MG capsule Commonly known as:  COLACE Take 1 capsule (100 mg total) by mouth 2 (two) times daily.   enoxaparin 40 MG/0.4ML injection Commonly known as:  LOVENOX Inject 0.4 mLs (40 mg total) into the skin daily. Start taking on:  05/11/2016   HYDROcodone-acetaminophen 5-325 MG tablet Commonly known as:  NORCO/VICODIN Take 1-2 tablets by mouth every 6 (six) hours as needed for severe pain.   hydroxychloroquine 200 MG tablet Commonly known as:  PLAQUENIL Take 200 mg by mouth 2 (two) times daily.   losartan 50 MG tablet Commonly known as:  COZAAR Take 50 mg by mouth daily.   methocarbamol 500 MG tablet Commonly known as:  ROBAXIN Take 1-2 tablets (500-1,000 mg total) by mouth every 6 (six) hours as needed for muscle spasms.   ranitidine  300 MG tablet Commonly known as:  ZANTAC Take 300 mg by mouth 2 (two) times daily.            Durable Medical Equipment        Start     Ordered   05/10/16 1033  For home use only DME 3 n 1  Once     05/10/16 1033   05/10/16 1020  For home use only DME standard manual wheelchair with seat cushion  Once    Comments:  Patient suffers from left tibial plateau fracture which impairs their ability to perform daily activities like bathing, dressing and toileting in the home.  A crutch or walker will not resolve  issue with performing activities of daily living. A wheelchair will allow patient to safely perform daily activities. Patient can safely propel the wheelchair in the home or has a caregiver who can provide assistance.  Accessories: elevating leg rests (ELRs), wheel locks, extensions and anti-tippers.   05/10/16 [facility] Follow up.   Why:  Home Health agency will contact you at home to arrange visit for Physical Therapy Contact information: [place] [facility] up.   Specialty:  Home Health Services Why:  [facility] will provider home health physical therapy. Contact information: [address] [place] [address] [phone]  Deaundra Kutzer H, MD. Schedule an appointment as soon as possible for a visit in 2 week(s).   Specialty:  Orthopedic Surgery Why:  orthopaedic follow up  Contact information: [place] [address] [place] [address] [phone]           Discharge Instructions and Plan:  76 y/o female s/p fall off step stool with L bicondylar tibial plateau fracture    -L bicondylar tibial plateau fracture             NWB x 8 weeks             Unrestricted ROM L knee                         Work on getting and maintaining full knee extension                          Unrestricted knee flexion                          Brace can be off when in bed                          No pillows under knee at rest, use bone foam or place pillows under ankle              Dressing changes as needed             TED hose    - Pain management:             Continue with current regimen              - ABL anemia/Hemodynamics             H/H stable             BP slightly elevated                         Restart home meds   - Medical issues              As above    - DVT/PE prophylaxis:             lovenox x 4 weeks   - ID:              periop abx completed     - Activity:             NWB L leg o/w as tolerated   - FEN/GI prophylaxis/Foley/Lines:             Reg diet              Dc IVF and IV     - Dispo:             Dc home today              Follow up with ortho in 2 weeks              Arrange for DME   Signed:  Jari Pigg, PA-C [facility] [phone] (P) 05/10/2016, 10:56 AM

## 2016-05-10 NOTE — Op Note (Signed)
NAME:  Joy Freeman, Joy Freeman                     ACCOUNT NO.:  MEDICAL RECORD NO.:  [id number]  LOCATION:                                 FACILITY:  PHYSICIAN:  Astrid Divine. Marcelino Scot, M.D.      DATE OF BIRTH:  DATE OF PROCEDURE:  05/07/2016 DATE OF DISCHARGE:                              OPERATIVE REPORT   PREOPERATIVE DIAGNOSIS:  Left bicondylar tibial plateau.  POSTOPERATIVE DIAGNOSES: 1. Leftt bicondylar tibial plateau. 2. Left lateral meniscus tear, mid body.  PROCEDURES: 1. Open reduction and internal fixation of left bicondylar tibial     plateau. 2. Repair of left lateral meniscus with an arthrotomy.  SURGEON:  Astrid Divine. Marcelino Scot, M.D.  ASSISTANT:  Ainsley Spinner, PA-C.  ANESTHESIA:  General.  COMPLICATIONS:  None.  I/O:  Please refer to the anesthesia record.  ESTIMATED BLOOD LOSS:  Less than 50 mL.  TOTAL TOURNIQUET TIME:  107 minutes.  DISPOSITION:  To PACU.  CONDITION:  Stable.  BRIEF SUMMARY AND INDICATION FOR PROCEDURE:  The patient is a 76 year old female, status post fall from a step with severe depression of the lateral tibial plateau and fracture line involving both the medial and lateral plateaus consistent with a bicondylar pattern.  I discussed with the patient the risks and benefits of surgical reconstruction given the severity of articular impaction including the likelihood of early progressive arthritis without surgical intervention as well as knee instability that would require total knee arthroplasty.  We also discussed the fragility of her bone and the potential for arthritis and subsequent need for total knee in spite of the repair.  I also discussed nerve injury, vessel injury, infection, compartment syndrome, malunion, nonunion, and others including heart attack, stroke.  She did wish to proceed.  BRIEF SUMMARY OF PROCEDURE:  The patient was taken to the operating room where general anesthesia was induced.  She did receive preop antibiotics.  Her  left lower extremity was prepped and draped in usual sterile fashion.  I placed a tourniquet about the thigh for elevation during elevation of the articular surface with entry into the tibial metaphysis.  I began with the tourniquet down, performing the curvilinear incision and exposing the joint and anterior compartment. An arthrotomy was performed superior to the joint line.  The coronary ligament incised along its insertion onto the tibia and reflected proximally.  This did reveal mid body tear of the meniscus at the junction of the capsule.  I was able to repair this back with 0 Prolene. I thoroughly examined the joint and noted significant articular depression and much more severe fragmentation than had been appreciated on the CT scan.  There was also a shell of the outer plateau along the retinaculum consistent with high fragility of her bone.  In order to elevate these in block and in sequence, a half-inch osteotome was used to create a cortical window in the metaphyseal flare of the lateral plateau.  I then took a variety of tamps and attempted in piecemeal fashion to very carefully elevate up the articular surface. Unfortunately, this was not successful and fragmentation began to occur almost medially despite the sequential instep while  stepwise fashion employed.  I also use some of the metaphyseal bone to bring up and put directly underneath the osteochondral segments.  I was able to use visualization of the joint superiorly to fine tune this.  I repeatedly checked along the femoral condyle and superior to the meniscus to make sure there were no bone fragments there.  I did pin these provisionally with K-wire fixation prior to placing the graft and then these tile-like fragments were visualized rotating or undulating.  I brought the Valora Corporal over them and tamped them down after placement of the graft and plate to get a congruent surface.  The superior Rafter of screws was  secured using a Erma Pinto clamp to bring in and compress the lateral plateau specifically.  This was after multiple K-wire fixation through all the holes to maintain the articular surface.  Standard fixation was then placed in the proximal row because of her very poor bone quality.  I then removed this and placed a locking screw such that I ended with 4 locking screws in the proximal Rafter, 3 below it and then 2 standard screws down in the shaft as well as 1 locked.  Final images showed appropriate elevation of the articular surface.  Appropriate restoration of alignment.  There was not complete congruity of the surface, but as much as could be obtained given the patient's bone quality.  Ainsley Spinner assisted me throughout with application of varus force to allow me to elevate the lateral plateau and provide fixation also with retraction. This varus also allowed me to visualize and repair the lateral meniscus. Wounds were irrigated thoroughly.  I did examine the knee for ligamentous stability and found it to be quite stable at 0 degrees and 30 degrees.  Standard layered closure was then performed.  PROGNOSIS:  The patient will be nonweightbearing with unrestricted range of motion immediately.  She will have a brace for supplemental support, but from the ligamentous examination today did not appear to require this, though may be a bit additionally beneficial to keep any sort of articular pressure from concentrating on the lateral plateau.  There is not complete articular congruity despite every effort to do so, but there is restored alignment, ligamentous stability, and an intact lateral meniscus all of which should help her to have the best outcome in spite of this very difficult injury.  She will be on formal pharmacologic DVT prophylaxis, and we would anticipate weightbearing progressively around 6 to 8 weeks depending on clinical progress and x- rays.  I would anticipate her ending up  needing a total knee replacement, but this would depend upon activity and again with the mitigating factors above, she may be able to avoid this in spite of the articular injury.     Astrid Divine. Marcelino Scot, M.D.     MHH/MEDQ  D:  05/07/2016  T:  05/07/2016  Job:  [id number]

## 2016-05-10 NOTE — Discharge Instructions (Signed)
[facility]uma [facility] Discharge Instructions   General Discharge Instructions  WEIGHT BEARING STATUS: nonweightbearing left lower extremity   RANGE OF MOTION/ACTIVITY:unrestricted range of motion left knee  Do not put a pillow under your knee when at rest. Place pillow under ankle or use bone foam to elevate leg. Ok to leave brace off when in bed. Put brace on when up and moving around   Wound Care: daily wound care starting on 05/12/2016. See instructions below Discharge Wound Care Instructions  Do NOT apply any ointments, solutions or lotions to pin sites or surgical wounds.  These prevent needed drainage and even though solutions like hydrogen peroxide kill bacteria, they also damage cells lining the pin sites that help fight infection.  Applying lotions or ointments can keep the wounds moist and can cause them to breakdown and open up as well. This can increase the risk for infection. When in doubt call the office.  Surgical incisions should be dressed daily.  If any drainage is noted, use one layer of adaptic, then gauze, Kerlix, and an ace wrap.  Once the incision is completely dry and without drainage, it may be left open to air out.  Showering may begin 36-48 hours later.  Cleaning gently with soap and water.  Traumatic wounds should be dressed daily as well.    One layer of adaptic, gauze, Kerlix, then ace wrap.  The adaptic can be discontinued once the draining has ceased    If you have a wet to dry dressing: wet the gauze with saline the squeeze as much saline out so the gauze is moist (not soaking wet), place moistened gauze over wound, then place a dry gauze over the moist one, followed by Kerlix wrap, then ace wrap.   PAIN MEDICATION USE AND EXPECTATIONS  You have likely been given narcotic medications to help control your pain.  After a traumatic event that results in an fracture (broken bone) with or without surgery, it is ok to use narcotic pain medications to help  control one's pain.  We understand that everyone responds to pain differently and each individual patient will be evaluated on a regular basis for the continued need for narcotic medications. Ideally, narcotic medication use should last no more than 6-8 weeks (coinciding with fracture healing).   As a patient it is your responsibility as well to monitor narcotic medication use and report the amount and frequency you use these medications when you come to your office visit.   We would also advise that if you are using narcotic medications, you should take a dose prior to therapy to maximize you participation.  IF YOU ARE ON NARCOTIC MEDICATIONS IT IS NOT PERMISSIBLE TO OPERATE A MOTOR VEHICLE (MOTORCYCLE/CAR/TRUCK/MOPED) OR HEAVY MACHINERY DO NOT MIX NARCOTICS WITH OTHER CNS (CENTRAL NERVOUS SYSTEM) DEPRESSANTS SUCH AS ALCOHOL  Diet: as you were eating previously.  Can use over the counter stool softeners and bowel preparations, such as Miralax, to help with bowel movements.  Narcotics can be constipating.  Be sure to drink plenty of fluids    STOP SMOKING OR USING NICOTINE PRODUCTS!!!!  As discussed nicotine severely impairs your body's ability to heal surgical and traumatic wounds but also impairs bone healing.  Wounds and bone heal by forming microscopic blood vessels (angiogenesis) and nicotine is a vasoconstrictor (essentially, shrinks blood vessels).  Therefore, if vasoconstriction occurs to these microscopic blood vessels they essentially disappear and are unable to deliver necessary nutrients to the healing tissue.  This is one modifiable  factor that you can do to dramatically increase your chances of healing your injury.    (This means no smoking, no nicotine gum, patches, etc)  DO NOT USE NONSTEROIDAL ANTI-INFLAMMATORY DRUGS (NSAID'S)  Using products such as Advil (ibuprofen), Aleve (naproxen), Motrin (ibuprofen) for additional pain control during fracture healing can delay and/or prevent  the healing response.  If you would like to take over the counter (OTC) medication, Tylenol (acetaminophen) is ok.  However, some narcotic medications that are given for pain control contain acetaminophen as well. Therefore, you should not exceed more than 4000 mg of tylenol in a day if you do not have liver disease.  Also note that there are may OTC medicines, such as cold medicines and allergy medicines that my contain tylenol as well.  If you have any questions about medications and/or interactions please ask your doctor/PA or your pharmacist.      ICE AND ELEVATE INJURED/OPERATIVE EXTREMITY  Using ice and elevating the injured extremity above your heart can help with swelling and pain control.  Icing in a pulsatile fashion, such as 20 minutes on and 20 minutes off, can be followed.    Do not place ice directly on skin. Make sure there is a barrier between to skin and the ice pack.    Using frozen items such as frozen peas works well as the conform nicely to the are that needs to be iced.  USE AN ACE WRAP OR TED HOSE FOR SWELLING CONTROL  In addition to icing and elevation, Ace wraps or TED hose are used to help limit and resolve swelling.  It is recommended to use Ace wraps or TED hose until you are informed to stop.    When using Ace Wraps start the wrapping distally (farthest away from the body) and wrap proximally (closer to the body)   Example: If you had surgery on your leg or thing and you do not have a splint on, start the ace wrap at the toes and work your way up to the thigh        If you had surgery on your upper extremity and do not have a splint on, start the ace wrap at your fingers and work your way up to the upper arm  IF YOU ARE IN A SPLINT OR CAST DO NOT [place]   If your splint gets wet for any reason please contact the office immediately. You may shower in your splint or cast as long as you keep it dry.  This can be done by wrapping in a cast cover or garbage  back (or similar)  Do Not stick any thing down your splint or cast such as pencils, money, or hangers to try and scratch yourself with.  If you feel itchy take benadryl as prescribed on the bottle for itching  IF YOU ARE IN A CAM BOOT (BLACK BOOT)  You may remove boot periodically. Perform daily dressing changes as noted below.  Wash the liner of the boot regularly and wear a sock when wearing the boot. It is recommended that you sleep in the boot until told otherwise  CALL THE OFFICE WITH ANY QUESTIONS OR CONCERNS: [phone]

## 2016-05-10 NOTE — Progress Notes (Signed)
Pt discharged from unit with family in attendance. All personal belongings with pt. Pt demonstrates no s/sx of distress. All discharges instructions and rx reviewed with pt and family. Lovenox kit home with pt. Daughter is Therapist, sports and is apprised on use of Lovenox per daughter.

## 2016-05-10 NOTE — Progress Notes (Signed)
Physical Therapy Treatment Patient Details Name: Joy Freeman MRN: [id number] DOB: 01-03-1941 Today's Date: 05/10/2016    History of Present Illness Pt admit for ORIF Left bicondylar tibial plateau, repair of lateral meniscus as she fell off stool 04/26/16.  Pt does have hx of GERD, HTN, recurrent migraines (improved over last several years).    PT Comments    Pt improving with mobility. Pt requires minA for bed mobility, sit<>stand transfers and ambulation of 20 feet using a rolling walker. Pt has 3 steps to enter her home and is unable to hop up on her own. Pt was alone in the room and could not discuss with husband how they planned to get in the house. Pt has new wheelchair in room but she feels that it will not fit up the stairs. Pt does state that her husband and daughter will be taking her home. PT will complete further education regarding stair training as able. Pt requires continued skilled PT to improve mobility and regain L knee ROM and L LE strength.     Follow Up Recommendations  Home health PT;Supervision/Assistance - 24 hour     Equipment Recommendations  3in1 (PT);Wheelchair (measurements PT);Wheelchair cushion (measurements PT);Rolling walker with 5" wheels (18x16 lightweight w/c, desk arms, anti tippers, elevate legs)    Recommendations for Other Services       Precautions / Restrictions Precautions Precautions: Fall;Knee Precaution Comments: No pillow under knee Required Braces or Orthoses: Other Brace/Splint Other Brace/Splint: LLE hinged brace Restrictions Weight Bearing Restrictions: Yes LLE Weight Bearing: Non weight bearing    Mobility  Bed Mobility Overal bed mobility: Needs Assistance Bed Mobility: Supine to Sit     Supine to sit: Min assist     General bed mobility comments: Min assist to move L LE off of bed and to scoot hips to EOB.  Transfers Overall transfer level: Needs assistance Equipment used: Rolling walker (2 wheeled) Transfers: Sit  to/from Stand Sit to Stand: Min assist         General transfer comment: vc for hand placement on bed for push off, minA for stabilization once upright  Ambulation/Gait Ambulation/Gait assistance: Min assist Ambulation Distance (Feet): 20 Feet Assistive device: Rolling walker (2 wheeled) Gait Pattern/deviations: Step-to pattern;Shuffle Gait velocity: slow Gait velocity interpretation: Below normal speed for age/gender General Gait Details: decreased foot clearance with R LE cues to use UE on RW to decrease weight through R LE   Stairs Stairs:  (Unable to due stair training as family was not present.)              Balance     Sitting balance-Leahy Scale: Good       Standing balance-Leahy Scale: Poor Standing balance comment: requires UE support on RW for balance.                    Cognition Arousal/Alertness: Awake/alert Behavior During Therapy: WFL for tasks assessed/performed Overall Cognitive Status: Within Functional Limits for tasks assessed                      Exercises Total Joint Exercises Long Arc Quad: AROM;10 reps;Seated Knee Flexion: AROM;Left;10 reps;Seated General Exercises - Lower Extremity Ankle Circles/Pumps: AROM;Both;Supine;10 reps Quad Sets: AROM;Left;10 reps Heel Slides: AAROM;5 reps;Left    General Comments        Pertinent Vitals/Pain Pain Assessment: 0-10 Pain Score: 2  Pain Location: left LE Pain Descriptors / Indicators: Aching;Burning  VSS  PT Goals (current goals can now be found in the care plan section) Acute Rehab PT Goals Patient Stated Goal: to go home PT Goal Formulation: With patient Time For Goal Achievement: 05/22/16 Potential to Achieve Goals: Good Progress towards PT goals: Progressing toward goals    Frequency    Min 6X/week      PT Plan Current plan remains appropriate    Co-evaluation             End of Session Equipment Utilized During Treatment: Gait belt  (Hinged brace left LE) Activity Tolerance: Patient tolerated treatment well Patient left: in chair;with call bell/phone within reach Nurse Communication: Mobility status PT Visit Diagnosis: Unsteadiness on feet (R26.81);Muscle weakness (generalized) (M62.81);Pain Pain - Right/Left: Left Pain - part of body: Leg     Time: 9629-5284 PT Time Calculation (min) (ACUTE ONLY): 24 min  Charges:  $Gait Training: 8-22 mins $Therapeutic Exercise: 8-22 mins                    G Codes:       Bailey Mech Fleet 05/10/2016, 1:33 PM  Dani Gobble. Migdalia Dk PT, DPT Acute Rehabilitation  [phone] Pager [phone]

## 2016-05-13 DIAGNOSIS — I10 Essential (primary) hypertension: Secondary | ICD-10-CM | POA: Diagnosis not present

## 2016-05-13 DIAGNOSIS — W11XXXD Fall on and from ladder, subsequent encounter: Secondary | ICD-10-CM | POA: Diagnosis not present

## 2016-05-13 DIAGNOSIS — Z7901 Long term (current) use of anticoagulants: Secondary | ICD-10-CM | POA: Diagnosis not present

## 2016-05-13 DIAGNOSIS — S82142D Displaced bicondylar fracture of left tibia, subsequent encounter for closed fracture with routine healing: Secondary | ICD-10-CM | POA: Diagnosis not present

## 2016-05-13 DIAGNOSIS — M150 Primary generalized (osteo)arthritis: Secondary | ICD-10-CM | POA: Diagnosis not present

## 2016-05-13 DIAGNOSIS — K219 Gastro-esophageal reflux disease without esophagitis: Secondary | ICD-10-CM | POA: Diagnosis not present

## 2016-05-20 DIAGNOSIS — K219 Gastro-esophageal reflux disease without esophagitis: Secondary | ICD-10-CM | POA: Diagnosis not present

## 2016-05-20 DIAGNOSIS — Z7901 Long term (current) use of anticoagulants: Secondary | ICD-10-CM | POA: Diagnosis not present

## 2016-05-20 DIAGNOSIS — W11XXXD Fall on and from ladder, subsequent encounter: Secondary | ICD-10-CM | POA: Diagnosis not present

## 2016-05-20 DIAGNOSIS — S82142D Displaced bicondylar fracture of left tibia, subsequent encounter for closed fracture with routine healing: Secondary | ICD-10-CM | POA: Diagnosis not present

## 2016-05-20 DIAGNOSIS — M150 Primary generalized (osteo)arthritis: Secondary | ICD-10-CM | POA: Diagnosis not present

## 2016-05-20 DIAGNOSIS — I10 Essential (primary) hypertension: Secondary | ICD-10-CM | POA: Diagnosis not present

## 2016-05-26 DIAGNOSIS — S82142D Displaced bicondylar fracture of left tibia, subsequent encounter for closed fracture with routine healing: Secondary | ICD-10-CM | POA: Diagnosis not present

## 2016-05-28 DIAGNOSIS — K219 Gastro-esophageal reflux disease without esophagitis: Secondary | ICD-10-CM | POA: Diagnosis not present

## 2016-05-28 DIAGNOSIS — S82142D Displaced bicondylar fracture of left tibia, subsequent encounter for closed fracture with routine healing: Secondary | ICD-10-CM | POA: Diagnosis not present

## 2016-05-28 DIAGNOSIS — I10 Essential (primary) hypertension: Secondary | ICD-10-CM | POA: Diagnosis not present

## 2016-05-28 DIAGNOSIS — M150 Primary generalized (osteo)arthritis: Secondary | ICD-10-CM | POA: Diagnosis not present

## 2016-05-28 DIAGNOSIS — Z7901 Long term (current) use of anticoagulants: Secondary | ICD-10-CM | POA: Diagnosis not present

## 2016-05-28 DIAGNOSIS — W11XXXD Fall on and from ladder, subsequent encounter: Secondary | ICD-10-CM | POA: Diagnosis not present

## 2016-06-01 DIAGNOSIS — W11XXXD Fall on and from ladder, subsequent encounter: Secondary | ICD-10-CM | POA: Diagnosis not present

## 2016-06-01 DIAGNOSIS — M150 Primary generalized (osteo)arthritis: Secondary | ICD-10-CM | POA: Diagnosis not present

## 2016-06-01 DIAGNOSIS — I10 Essential (primary) hypertension: Secondary | ICD-10-CM | POA: Diagnosis not present

## 2016-06-01 DIAGNOSIS — S82142D Displaced bicondylar fracture of left tibia, subsequent encounter for closed fracture with routine healing: Secondary | ICD-10-CM | POA: Diagnosis not present

## 2016-06-01 DIAGNOSIS — K219 Gastro-esophageal reflux disease without esophagitis: Secondary | ICD-10-CM | POA: Diagnosis not present

## 2016-06-01 DIAGNOSIS — Z7901 Long term (current) use of anticoagulants: Secondary | ICD-10-CM | POA: Diagnosis not present

## 2016-06-03 DIAGNOSIS — S82142D Displaced bicondylar fracture of left tibia, subsequent encounter for closed fracture with routine healing: Secondary | ICD-10-CM | POA: Diagnosis not present

## 2016-06-03 DIAGNOSIS — K219 Gastro-esophageal reflux disease without esophagitis: Secondary | ICD-10-CM | POA: Diagnosis not present

## 2016-06-03 DIAGNOSIS — M150 Primary generalized (osteo)arthritis: Secondary | ICD-10-CM | POA: Diagnosis not present

## 2016-06-03 DIAGNOSIS — I10 Essential (primary) hypertension: Secondary | ICD-10-CM | POA: Diagnosis not present

## 2016-06-03 DIAGNOSIS — Z7901 Long term (current) use of anticoagulants: Secondary | ICD-10-CM | POA: Diagnosis not present

## 2016-06-03 DIAGNOSIS — W11XXXD Fall on and from ladder, subsequent encounter: Secondary | ICD-10-CM | POA: Diagnosis not present

## 2016-06-09 DIAGNOSIS — K219 Gastro-esophageal reflux disease without esophagitis: Secondary | ICD-10-CM | POA: Diagnosis not present

## 2016-06-09 DIAGNOSIS — W11XXXD Fall on and from ladder, subsequent encounter: Secondary | ICD-10-CM | POA: Diagnosis not present

## 2016-06-09 DIAGNOSIS — M150 Primary generalized (osteo)arthritis: Secondary | ICD-10-CM | POA: Diagnosis not present

## 2016-06-09 DIAGNOSIS — Z7901 Long term (current) use of anticoagulants: Secondary | ICD-10-CM | POA: Diagnosis not present

## 2016-06-09 DIAGNOSIS — I10 Essential (primary) hypertension: Secondary | ICD-10-CM | POA: Diagnosis not present

## 2016-06-09 DIAGNOSIS — S82142D Displaced bicondylar fracture of left tibia, subsequent encounter for closed fracture with routine healing: Secondary | ICD-10-CM | POA: Diagnosis not present

## 2016-06-11 DIAGNOSIS — S82142D Displaced bicondylar fracture of left tibia, subsequent encounter for closed fracture with routine healing: Secondary | ICD-10-CM | POA: Diagnosis not present

## 2016-06-11 DIAGNOSIS — M150 Primary generalized (osteo)arthritis: Secondary | ICD-10-CM | POA: Diagnosis not present

## 2016-06-11 DIAGNOSIS — I10 Essential (primary) hypertension: Secondary | ICD-10-CM | POA: Diagnosis not present

## 2016-06-11 DIAGNOSIS — Z7901 Long term (current) use of anticoagulants: Secondary | ICD-10-CM | POA: Diagnosis not present

## 2016-06-11 DIAGNOSIS — K219 Gastro-esophageal reflux disease without esophagitis: Secondary | ICD-10-CM | POA: Diagnosis not present

## 2016-06-11 DIAGNOSIS — W11XXXD Fall on and from ladder, subsequent encounter: Secondary | ICD-10-CM | POA: Diagnosis not present

## 2016-06-23 DIAGNOSIS — S82142D Displaced bicondylar fracture of left tibia, subsequent encounter for closed fracture with routine healing: Secondary | ICD-10-CM | POA: Diagnosis not present

## 2016-07-22 DIAGNOSIS — I10 Essential (primary) hypertension: Secondary | ICD-10-CM | POA: Diagnosis not present

## 2016-07-22 DIAGNOSIS — N182 Chronic kidney disease, stage 2 (mild): Secondary | ICD-10-CM | POA: Diagnosis not present

## 2016-07-22 DIAGNOSIS — G43109 Migraine with aura, not intractable, without status migrainosus: Secondary | ICD-10-CM | POA: Diagnosis not present

## 2016-07-22 DIAGNOSIS — M0589 Other rheumatoid arthritis with rheumatoid factor of multiple sites: Secondary | ICD-10-CM | POA: Diagnosis not present

## 2016-09-01 DIAGNOSIS — S83262D Peripheral tear of lateral meniscus, current injury, left knee, subsequent encounter: Secondary | ICD-10-CM | POA: Diagnosis not present

## 2016-09-01 DIAGNOSIS — S82142D Displaced bicondylar fracture of left tibia, subsequent encounter for closed fracture with routine healing: Secondary | ICD-10-CM | POA: Diagnosis not present

## 2016-10-18 DIAGNOSIS — Z1231 Encounter for screening mammogram for malignant neoplasm of breast: Secondary | ICD-10-CM | POA: Diagnosis not present

## 2016-10-27 DIAGNOSIS — M19011 Primary osteoarthritis, right shoulder: Secondary | ICD-10-CM | POA: Diagnosis not present

## 2016-10-27 DIAGNOSIS — I10 Essential (primary) hypertension: Secondary | ICD-10-CM | POA: Diagnosis not present

## 2016-10-27 DIAGNOSIS — G43109 Migraine with aura, not intractable, without status migrainosus: Secondary | ICD-10-CM | POA: Diagnosis not present

## 2016-10-27 DIAGNOSIS — Z79899 Other long term (current) drug therapy: Secondary | ICD-10-CM | POA: Diagnosis not present

## 2016-10-27 DIAGNOSIS — M0589 Other rheumatoid arthritis with rheumatoid factor of multiple sites: Secondary | ICD-10-CM | POA: Diagnosis not present

## 2016-10-27 DIAGNOSIS — N182 Chronic kidney disease, stage 2 (mild): Secondary | ICD-10-CM | POA: Diagnosis not present

## 2016-11-10 DIAGNOSIS — S83262D Peripheral tear of lateral meniscus, current injury, left knee, subsequent encounter: Secondary | ICD-10-CM | POA: Diagnosis not present

## 2016-11-10 DIAGNOSIS — S82142D Displaced bicondylar fracture of left tibia, subsequent encounter for closed fracture with routine healing: Secondary | ICD-10-CM | POA: Diagnosis not present

## 2016-12-11 DIAGNOSIS — Z23 Encounter for immunization: Secondary | ICD-10-CM | POA: Diagnosis not present

## 2017-01-28 DIAGNOSIS — G43109 Migraine with aura, not intractable, without status migrainosus: Secondary | ICD-10-CM | POA: Diagnosis not present

## 2017-01-28 DIAGNOSIS — N182 Chronic kidney disease, stage 2 (mild): Secondary | ICD-10-CM | POA: Diagnosis not present

## 2017-01-28 DIAGNOSIS — M0589 Other rheumatoid arthritis with rheumatoid factor of multiple sites: Secondary | ICD-10-CM | POA: Diagnosis not present

## 2017-01-28 DIAGNOSIS — I10 Essential (primary) hypertension: Secondary | ICD-10-CM | POA: Diagnosis not present

## 2017-05-03 DIAGNOSIS — G43109 Migraine with aura, not intractable, without status migrainosus: Secondary | ICD-10-CM | POA: Diagnosis not present

## 2017-05-03 DIAGNOSIS — N182 Chronic kidney disease, stage 2 (mild): Secondary | ICD-10-CM | POA: Diagnosis not present

## 2017-05-03 DIAGNOSIS — I10 Essential (primary) hypertension: Secondary | ICD-10-CM | POA: Diagnosis not present

## 2017-05-03 DIAGNOSIS — M0589 Other rheumatoid arthritis with rheumatoid factor of multiple sites: Secondary | ICD-10-CM | POA: Diagnosis not present

## 2017-07-04 DIAGNOSIS — M810 Age-related osteoporosis without current pathological fracture: Secondary | ICD-10-CM | POA: Diagnosis not present

## 2017-08-04 DIAGNOSIS — M0589 Other rheumatoid arthritis with rheumatoid factor of multiple sites: Secondary | ICD-10-CM | POA: Diagnosis not present

## 2017-08-04 DIAGNOSIS — N182 Chronic kidney disease, stage 2 (mild): Secondary | ICD-10-CM | POA: Diagnosis not present

## 2017-08-04 DIAGNOSIS — I10 Essential (primary) hypertension: Secondary | ICD-10-CM | POA: Diagnosis not present

## 2017-08-04 DIAGNOSIS — G43109 Migraine with aura, not intractable, without status migrainosus: Secondary | ICD-10-CM | POA: Diagnosis not present

## 2017-11-09 DIAGNOSIS — N182 Chronic kidney disease, stage 2 (mild): Secondary | ICD-10-CM | POA: Diagnosis not present

## 2017-11-09 DIAGNOSIS — G43109 Migraine with aura, not intractable, without status migrainosus: Secondary | ICD-10-CM | POA: Diagnosis not present

## 2017-11-09 DIAGNOSIS — I10 Essential (primary) hypertension: Secondary | ICD-10-CM | POA: Diagnosis not present

## 2017-11-09 DIAGNOSIS — Z1389 Encounter for screening for other disorder: Secondary | ICD-10-CM | POA: Diagnosis not present

## 2017-11-09 DIAGNOSIS — M0589 Other rheumatoid arthritis with rheumatoid factor of multiple sites: Secondary | ICD-10-CM | POA: Diagnosis not present

## 2017-11-09 DIAGNOSIS — Z0000 Encounter for general adult medical examination without abnormal findings: Secondary | ICD-10-CM | POA: Diagnosis not present

## 2018-02-20 DIAGNOSIS — I10 Essential (primary) hypertension: Secondary | ICD-10-CM | POA: Diagnosis not present

## 2018-02-20 DIAGNOSIS — R05 Cough: Secondary | ICD-10-CM | POA: Diagnosis not present

## 2018-02-20 DIAGNOSIS — K210 Gastro-esophageal reflux disease with esophagitis: Secondary | ICD-10-CM | POA: Diagnosis not present

## 2018-02-20 DIAGNOSIS — N182 Chronic kidney disease, stage 2 (mild): Secondary | ICD-10-CM | POA: Diagnosis not present

## 2018-02-20 DIAGNOSIS — G43109 Migraine with aura, not intractable, without status migrainosus: Secondary | ICD-10-CM | POA: Diagnosis not present

## 2018-02-20 DIAGNOSIS — M0589 Other rheumatoid arthritis with rheumatoid factor of multiple sites: Secondary | ICD-10-CM | POA: Diagnosis not present

## 2018-04-17 DIAGNOSIS — H25813 Combined forms of age-related cataract, bilateral: Secondary | ICD-10-CM | POA: Diagnosis not present

## 2018-04-17 DIAGNOSIS — H52223 Regular astigmatism, bilateral: Secondary | ICD-10-CM | POA: Diagnosis not present

## 2018-04-17 DIAGNOSIS — H5213 Myopia, bilateral: Secondary | ICD-10-CM | POA: Diagnosis not present

## 2018-04-17 DIAGNOSIS — H47093 Other disorders of optic nerve, not elsewhere classified, bilateral: Secondary | ICD-10-CM | POA: Diagnosis not present

## 2018-04-17 DIAGNOSIS — H11042 Peripheral pterygium, stationary, left eye: Secondary | ICD-10-CM | POA: Diagnosis not present

## 2018-04-17 DIAGNOSIS — H524 Presbyopia: Secondary | ICD-10-CM | POA: Diagnosis not present

## 2018-06-30 DIAGNOSIS — H2512 Age-related nuclear cataract, left eye: Secondary | ICD-10-CM | POA: Diagnosis not present

## 2018-06-30 DIAGNOSIS — H2513 Age-related nuclear cataract, bilateral: Secondary | ICD-10-CM | POA: Diagnosis not present

## 2018-07-13 DIAGNOSIS — N182 Chronic kidney disease, stage 2 (mild): Secondary | ICD-10-CM | POA: Diagnosis not present

## 2018-07-13 DIAGNOSIS — G43109 Migraine with aura, not intractable, without status migrainosus: Secondary | ICD-10-CM | POA: Diagnosis not present

## 2018-07-13 DIAGNOSIS — M0589 Other rheumatoid arthritis with rheumatoid factor of multiple sites: Secondary | ICD-10-CM | POA: Diagnosis not present

## 2018-07-13 DIAGNOSIS — K210 Gastro-esophageal reflux disease with esophagitis: Secondary | ICD-10-CM | POA: Diagnosis not present

## 2018-07-13 DIAGNOSIS — I10 Essential (primary) hypertension: Secondary | ICD-10-CM | POA: Diagnosis not present

## 2018-07-19 DIAGNOSIS — H5703 Miosis: Secondary | ICD-10-CM | POA: Diagnosis not present

## 2018-07-19 DIAGNOSIS — H2512 Age-related nuclear cataract, left eye: Secondary | ICD-10-CM | POA: Diagnosis not present

## 2018-08-08 DIAGNOSIS — H2511 Age-related nuclear cataract, right eye: Secondary | ICD-10-CM | POA: Diagnosis not present

## 2018-08-08 DIAGNOSIS — H5703 Miosis: Secondary | ICD-10-CM | POA: Diagnosis not present

## 2018-09-08 IMAGING — RF DG C-ARM 61-120 MIN
1 series · 4 of 4 positions shown · non-contrast
Comparison: 05/03/2016

CLINICAL DATA: Left tibial plateau fracture

EXAM:
DG C-ARM 61-120 MIN; LEFT KNEE - 1-2 VIEW

[Series 1: run · 4 of 4 slices shown]
[im 1/4]
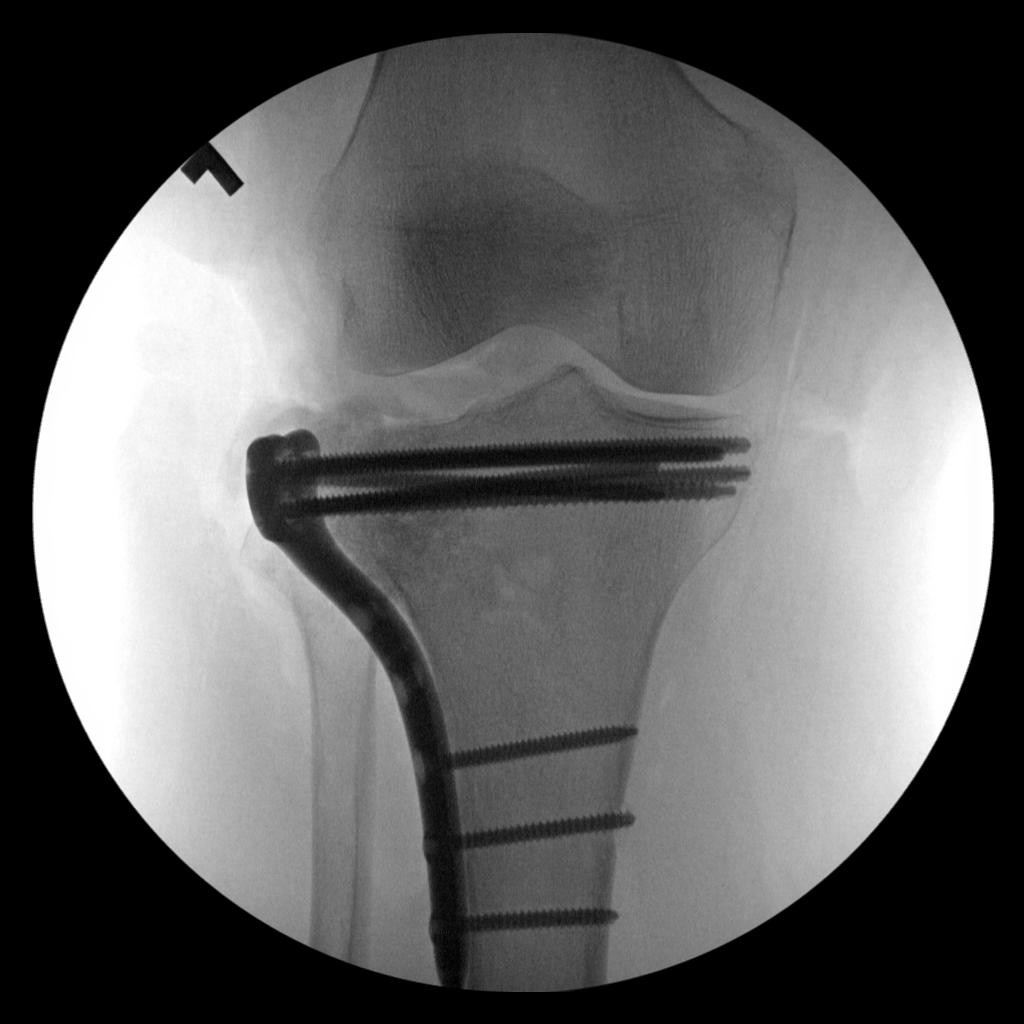
[im 2/4]
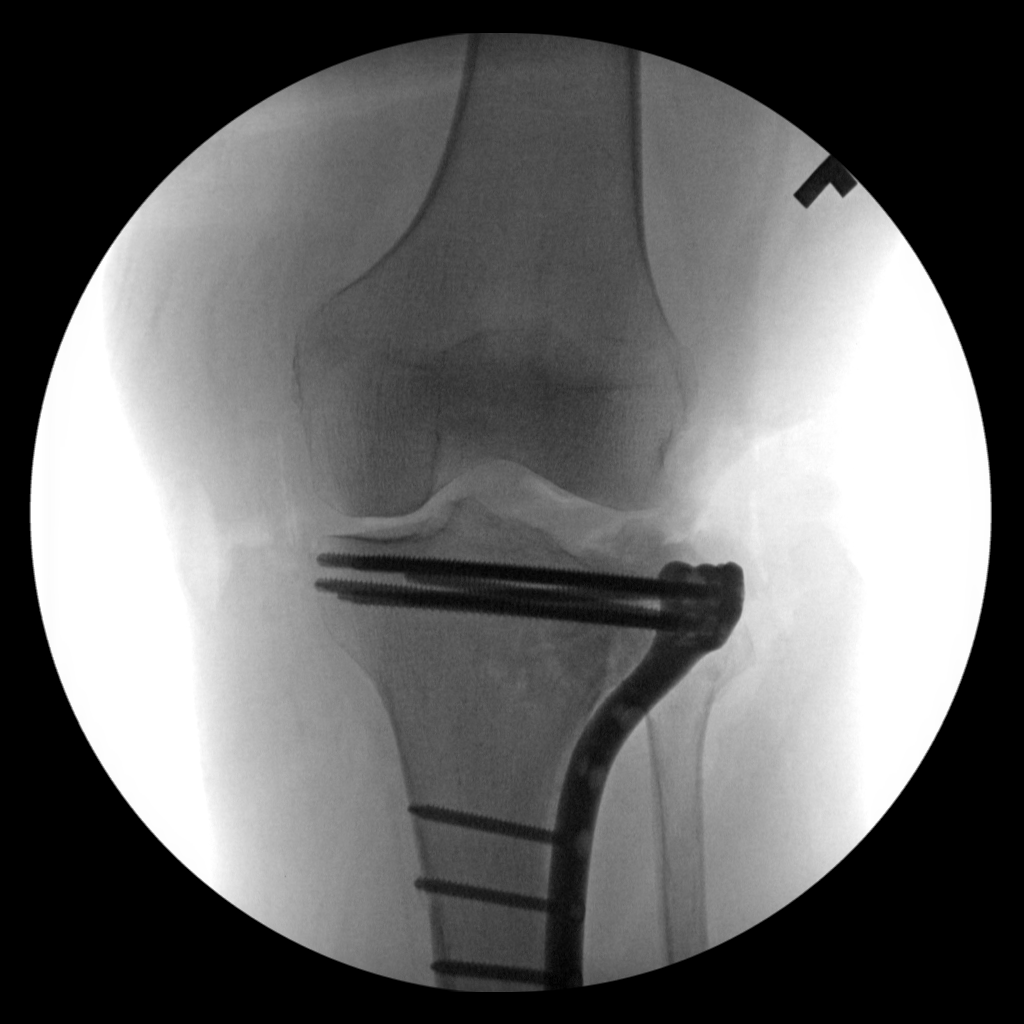
[im 3/4]
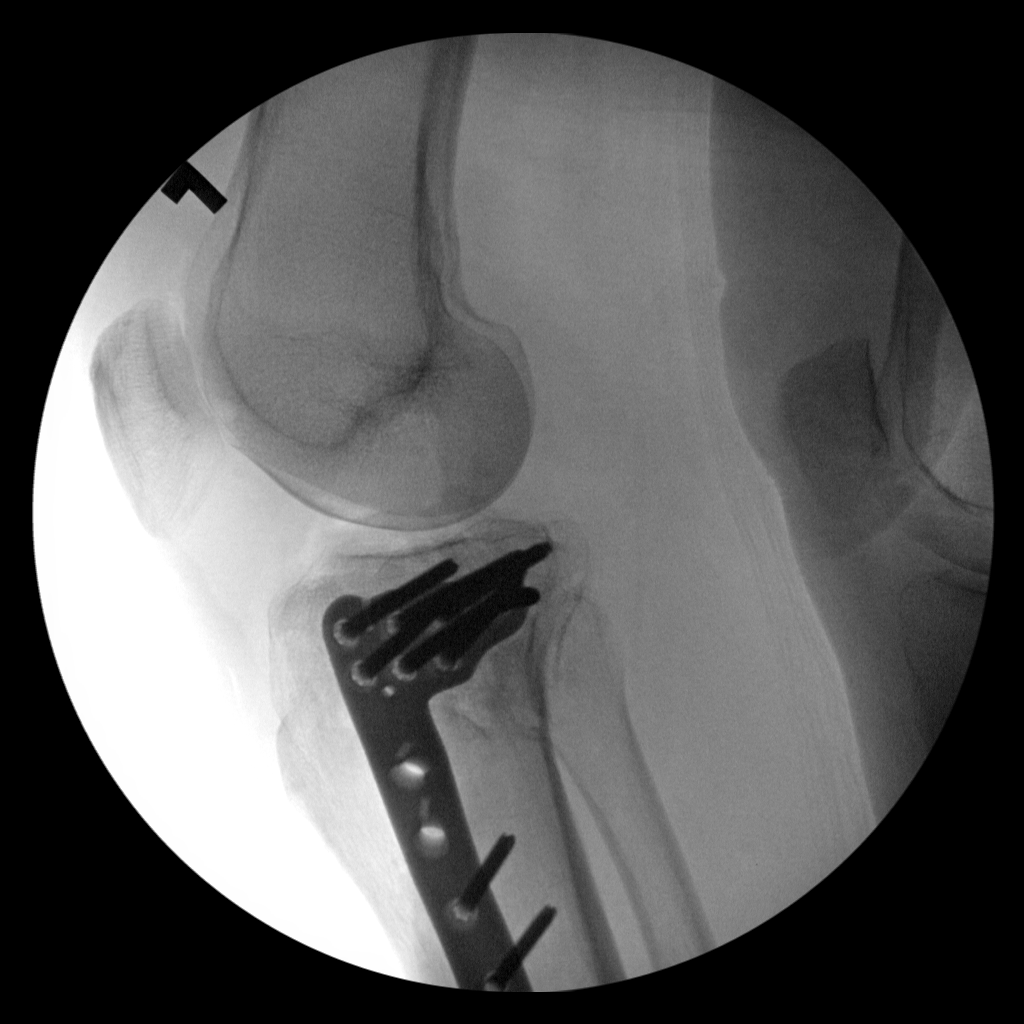
[im 4/4]
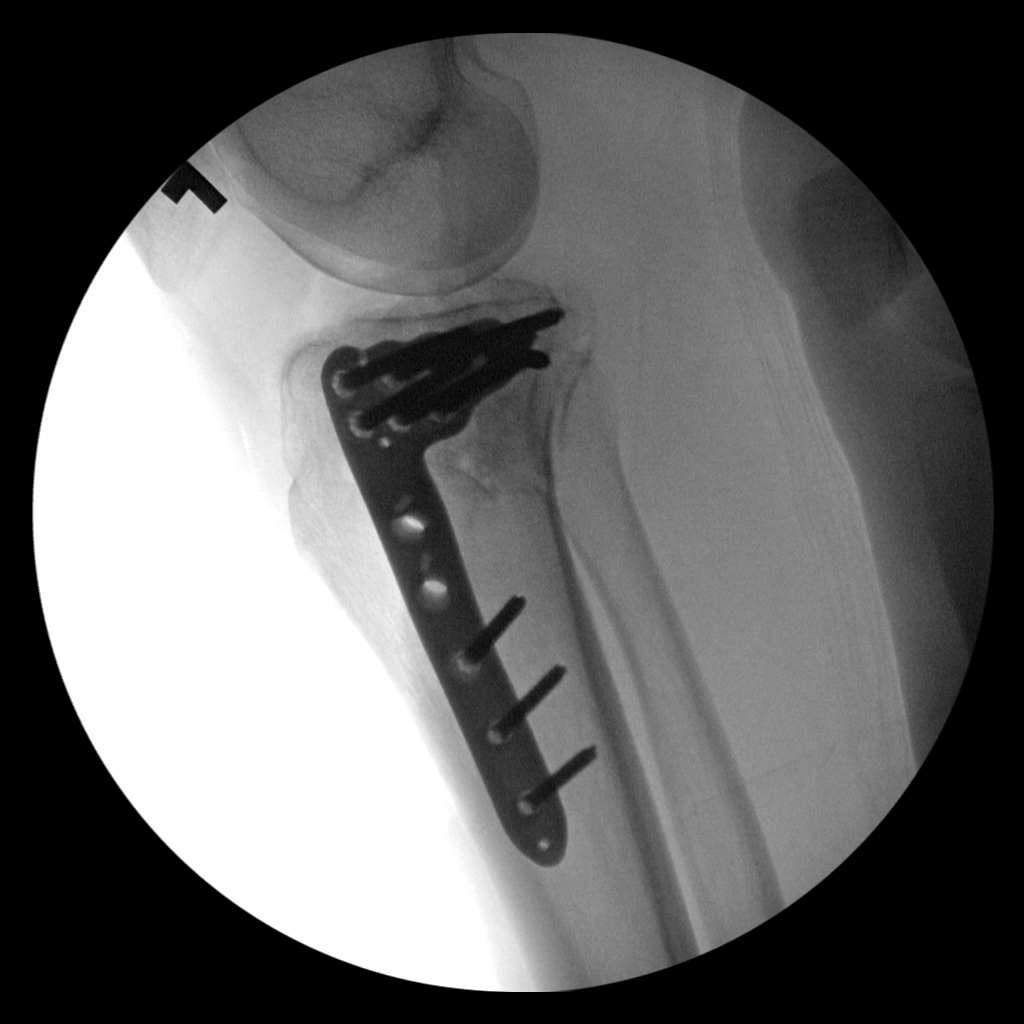

[4 of 4 positions shown; findings below may reference images not displayed]

FLUOROSCOPY TIME:  Radiation Exposure Index (as provided by the
fluoroscopic device): Not available

If the device does not provide the exposure index:

Fluoroscopy Time:  39 seconds

Number of Acquired Images:  4
FINDINGS: Fixation sideplate is noted along the lateral aspect of the proximal
tibia. Multiple fixation screws are seen. The fracture fragments are
in near anatomic alignment. No new abnormality is seen.
IMPRESSION: ORIF of proximal left tibial fracture.

## 2018-09-08 IMAGING — CR DG CHEST 1V PORT
1 series · 1 of 1 positions shown · non-contrast
Comparison: None.

CLINICAL DATA: Preoperative respiratory exam far ORIF of the left
knee for tibial plateau fractures.

EXAM:
PORTABLE CHEST 1 VIEW

[AP]
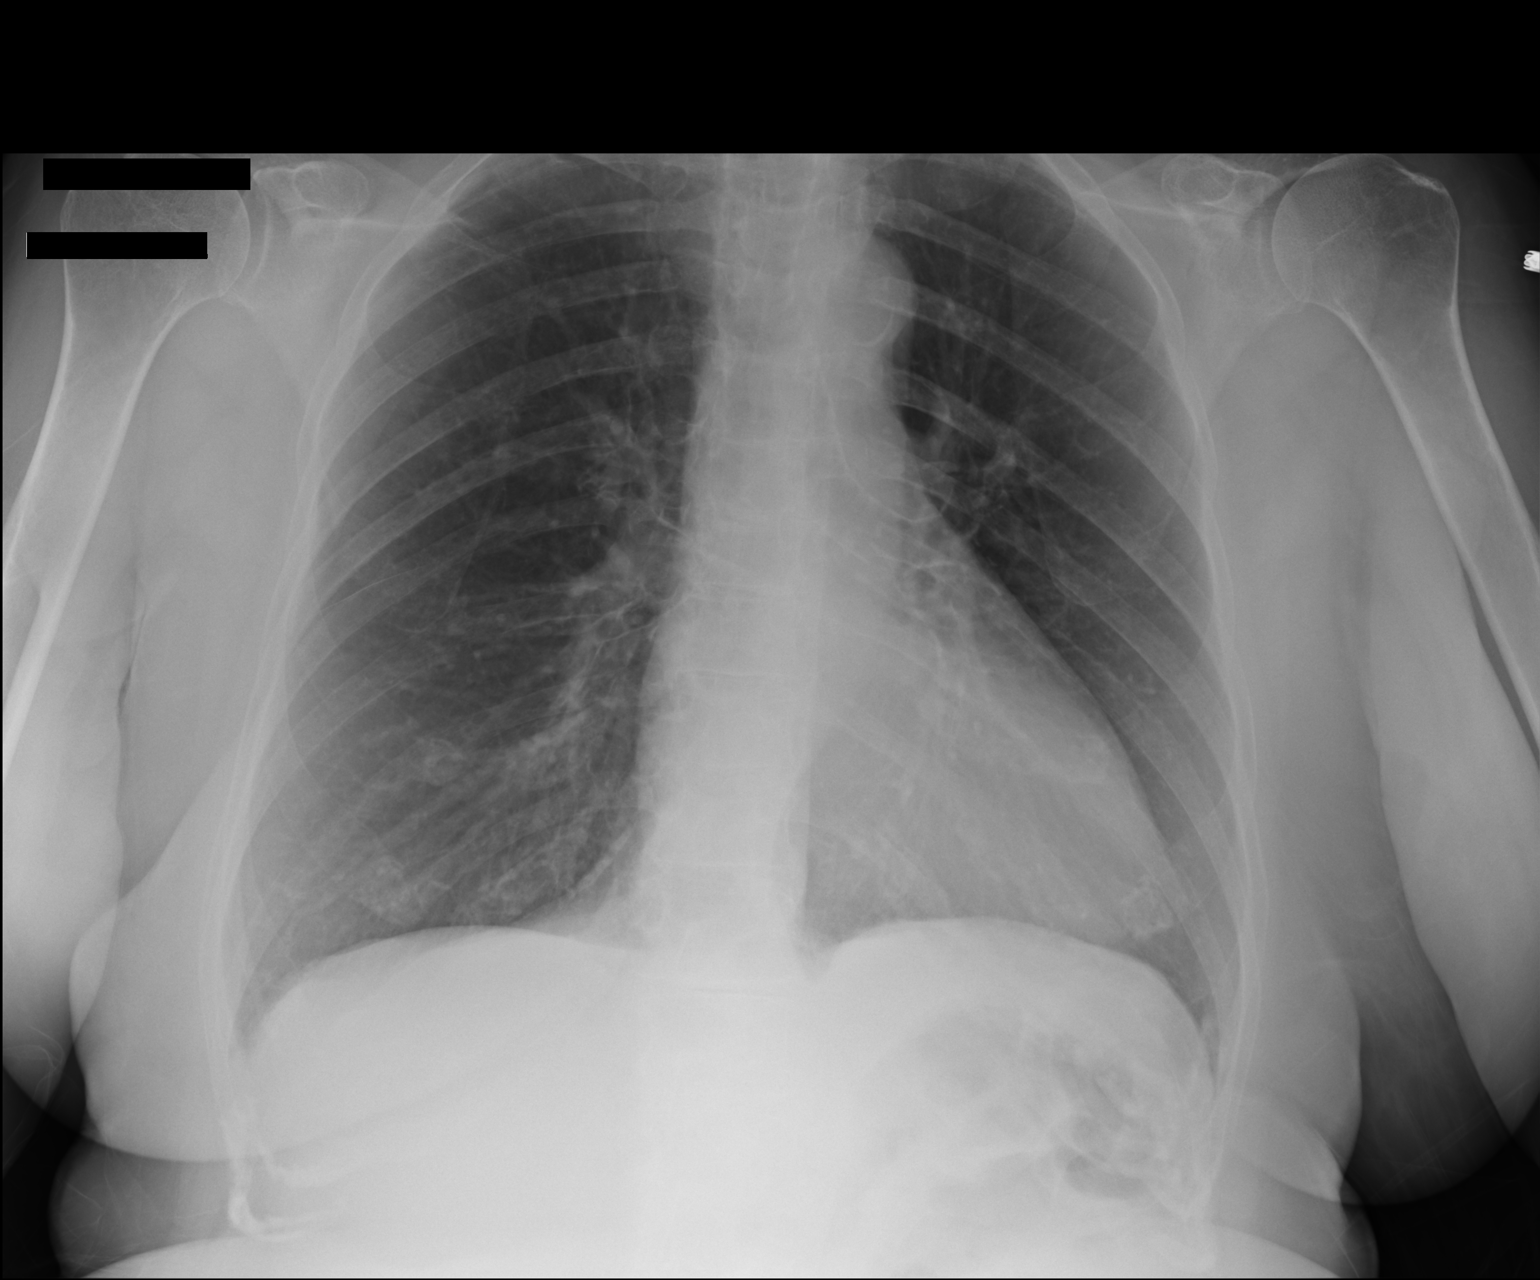

[1 of 1 positions shown; findings below may reference images not displayed]

FINDINGS: The heart size and mediastinal contours are within normal limits.
There is no evidence of pulmonary edema, consolidation,
pneumothorax, nodule or pleural fluid. The visualized skeletal
structures are unremarkable.
IMPRESSION: No active disease.

## 2018-09-08 IMAGING — CR DG KNEE 1-2V PORT*L*
2 series · 2 of 2 positions shown · non-contrast
Comparison: Earlier today.

CLINICAL DATA: Left tibial plateau fracture.

EXAM:
PORTABLE LEFT KNEE - 1-2 VIEW

[AP]
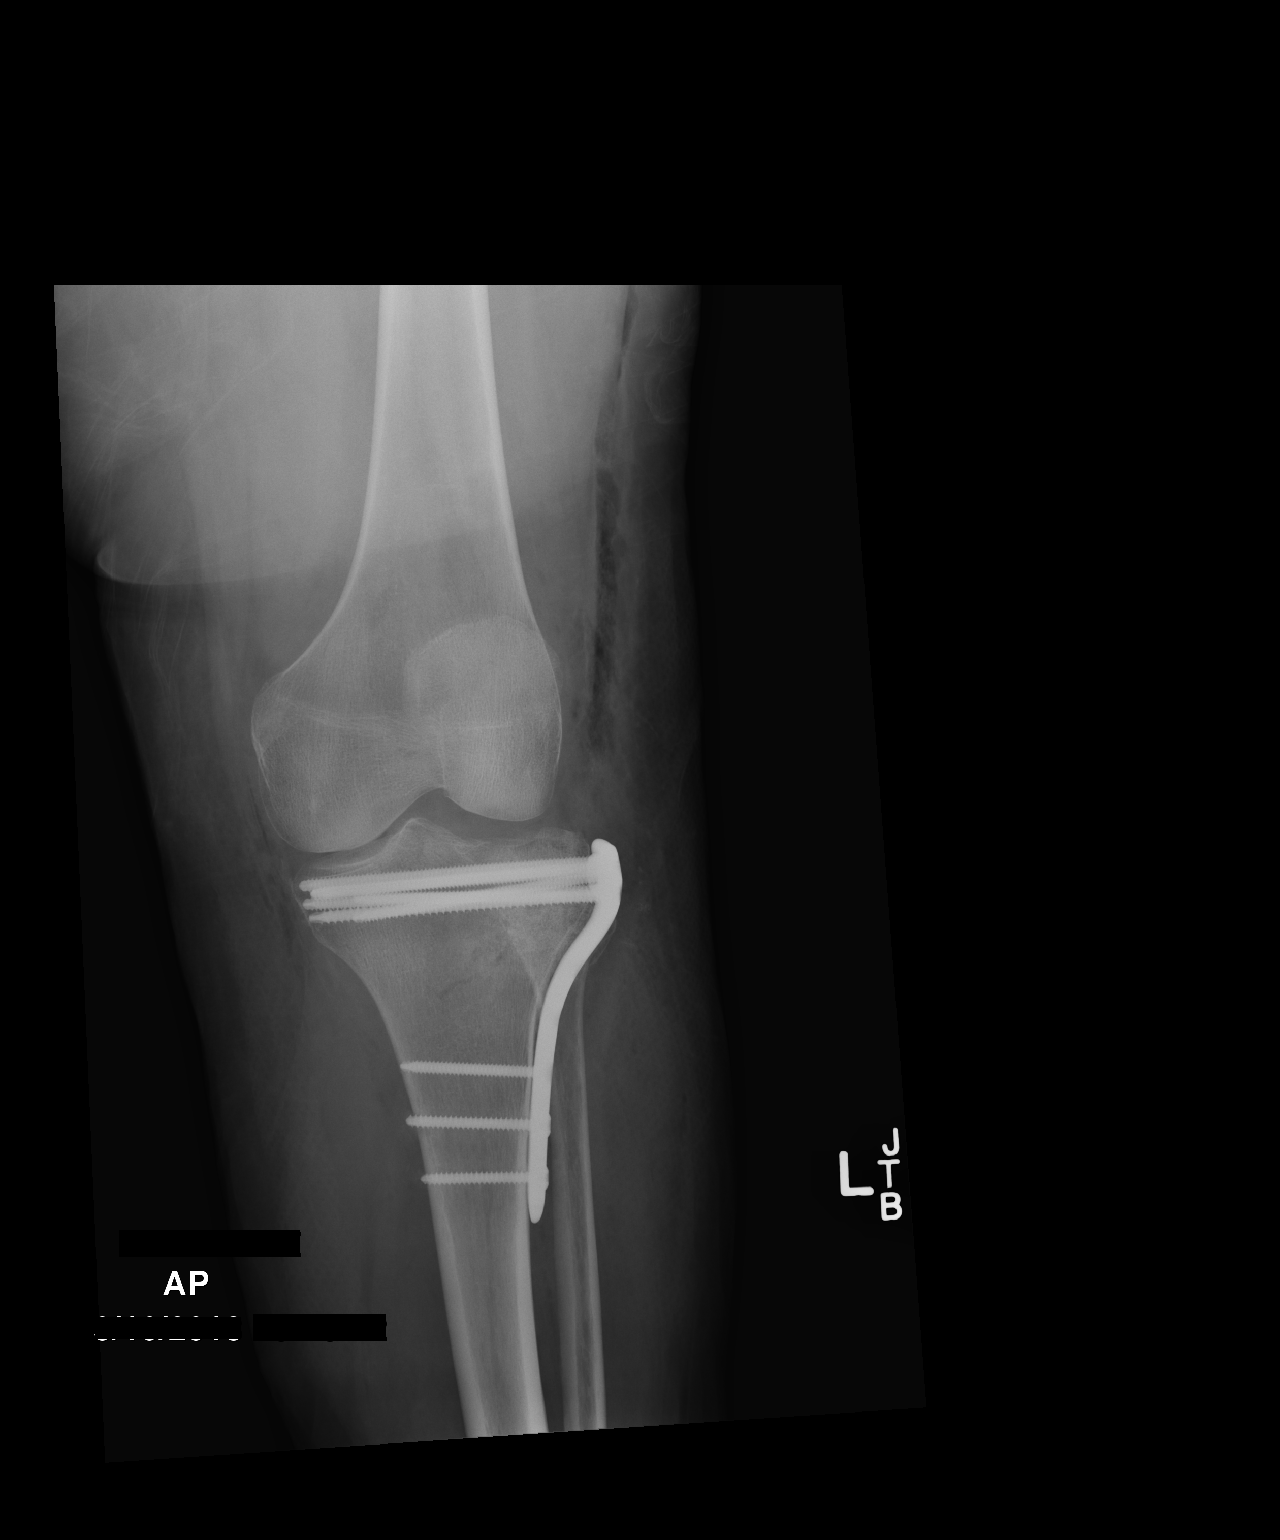

[xtable lateral]
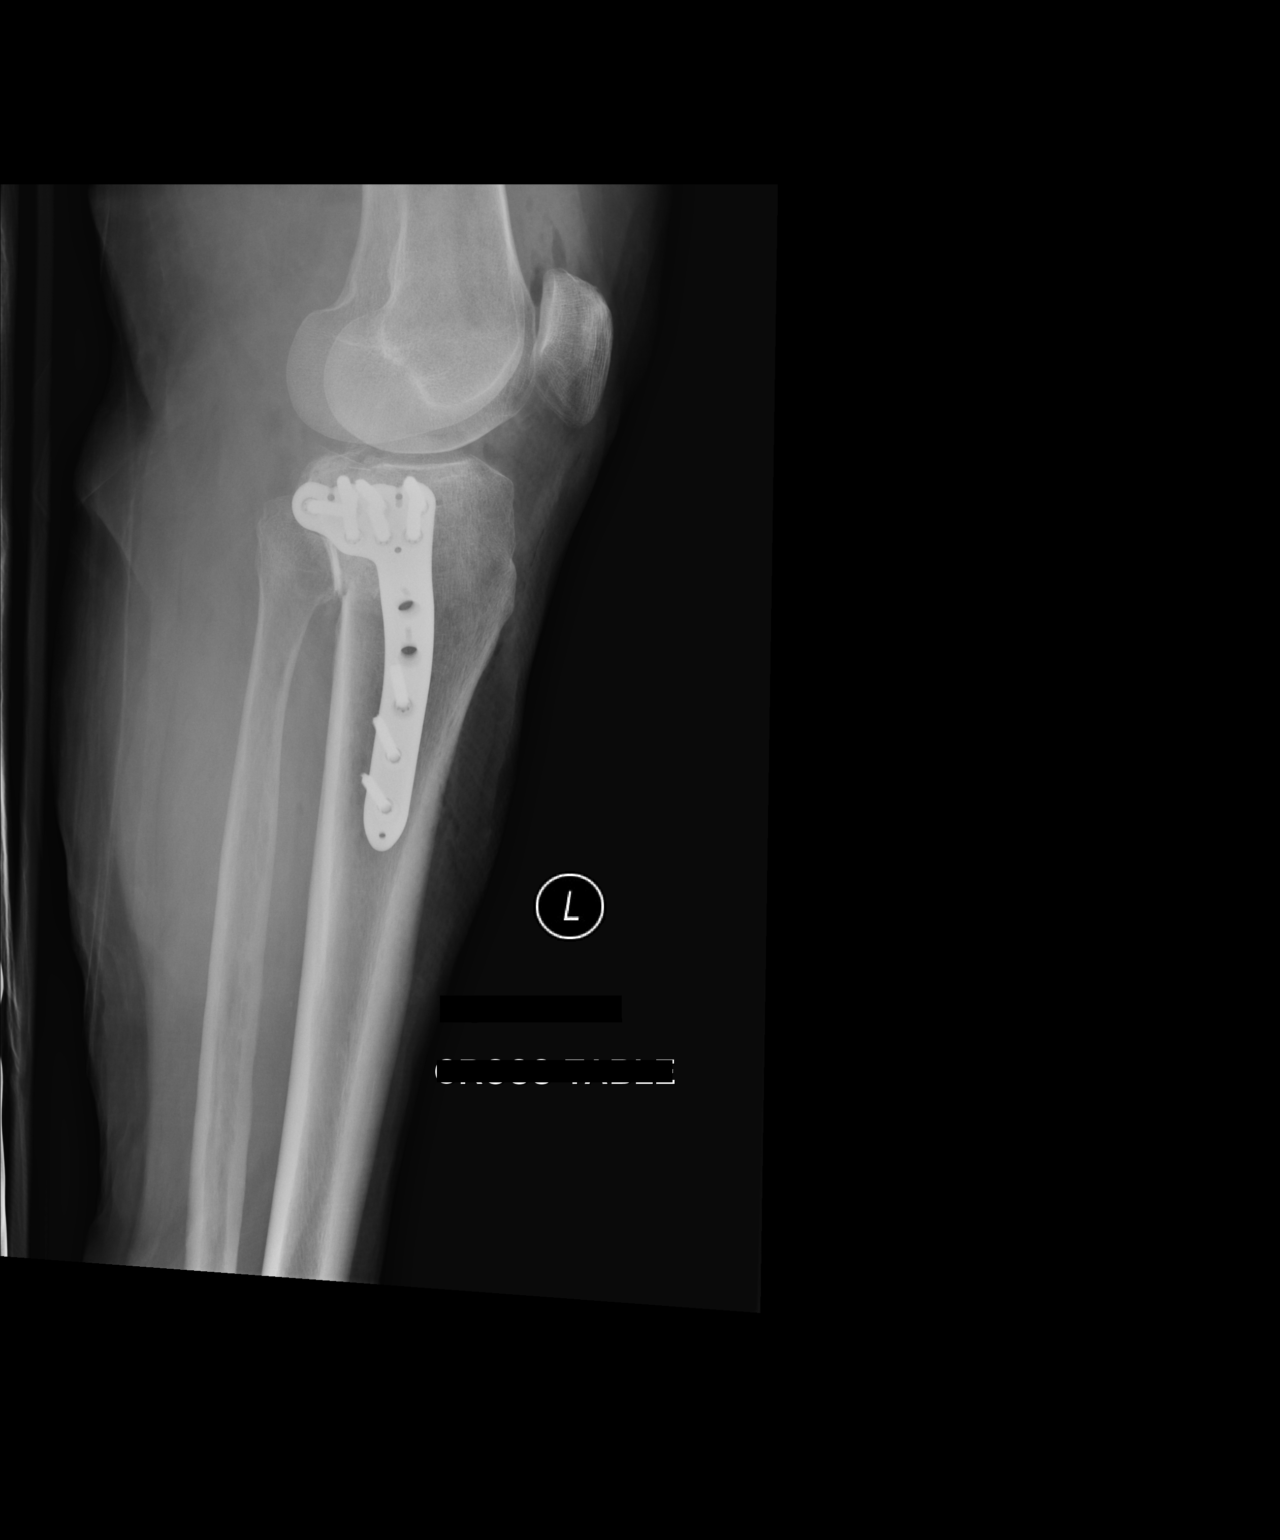

[2 of 2 positions shown; findings below may reference images not displayed]

FINDINGS: Again demonstrated is screw and plate fixation of the previously
demonstrated medial and lateral tibial plateau fractures. Mild
widening of the lateral joint space.
IMPRESSION: Hardware fixation of the previously demonstrated medial and lateral
tibial plateau fractures.

## 2018-09-25 ENCOUNTER — Other Ambulatory Visit: Payer: Self-pay

## 2018-10-16 DIAGNOSIS — R05 Cough: Secondary | ICD-10-CM | POA: Diagnosis not present

## 2018-10-16 DIAGNOSIS — I10 Essential (primary) hypertension: Secondary | ICD-10-CM | POA: Diagnosis not present

## 2018-10-16 DIAGNOSIS — K210 Gastro-esophageal reflux disease with esophagitis: Secondary | ICD-10-CM | POA: Diagnosis not present

## 2018-10-16 DIAGNOSIS — M0589 Other rheumatoid arthritis with rheumatoid factor of multiple sites: Secondary | ICD-10-CM | POA: Diagnosis not present

## 2018-10-16 DIAGNOSIS — N182 Chronic kidney disease, stage 2 (mild): Secondary | ICD-10-CM | POA: Diagnosis not present

## 2018-10-16 DIAGNOSIS — G43109 Migraine with aura, not intractable, without status migrainosus: Secondary | ICD-10-CM | POA: Diagnosis not present

## 2019-01-22 DIAGNOSIS — Z1389 Encounter for screening for other disorder: Secondary | ICD-10-CM | POA: Diagnosis not present

## 2019-01-22 DIAGNOSIS — M0589 Other rheumatoid arthritis with rheumatoid factor of multiple sites: Secondary | ICD-10-CM | POA: Diagnosis not present

## 2019-01-22 DIAGNOSIS — I10 Essential (primary) hypertension: Secondary | ICD-10-CM | POA: Diagnosis not present

## 2019-01-22 DIAGNOSIS — N182 Chronic kidney disease, stage 2 (mild): Secondary | ICD-10-CM | POA: Diagnosis not present

## 2019-01-22 DIAGNOSIS — Z0000 Encounter for general adult medical examination without abnormal findings: Secondary | ICD-10-CM | POA: Diagnosis not present

## 2019-01-22 DIAGNOSIS — G43109 Migraine with aura, not intractable, without status migrainosus: Secondary | ICD-10-CM | POA: Diagnosis not present

## 2019-01-22 DIAGNOSIS — K219 Gastro-esophageal reflux disease without esophagitis: Secondary | ICD-10-CM | POA: Diagnosis not present

## 2019-04-30 DIAGNOSIS — G43109 Migraine with aura, not intractable, without status migrainosus: Secondary | ICD-10-CM | POA: Diagnosis not present

## 2019-04-30 DIAGNOSIS — N182 Chronic kidney disease, stage 2 (mild): Secondary | ICD-10-CM | POA: Diagnosis not present

## 2019-04-30 DIAGNOSIS — K219 Gastro-esophageal reflux disease without esophagitis: Secondary | ICD-10-CM | POA: Diagnosis not present

## 2019-04-30 DIAGNOSIS — I10 Essential (primary) hypertension: Secondary | ICD-10-CM | POA: Diagnosis not present

## 2019-07-18 DIAGNOSIS — Z23 Encounter for immunization: Secondary | ICD-10-CM | POA: Diagnosis not present

## 2019-11-12 DIAGNOSIS — I10 Essential (primary) hypertension: Secondary | ICD-10-CM | POA: Diagnosis not present

## 2019-11-12 DIAGNOSIS — M0589 Other rheumatoid arthritis with rheumatoid factor of multiple sites: Secondary | ICD-10-CM | POA: Diagnosis not present

## 2019-11-12 DIAGNOSIS — N182 Chronic kidney disease, stage 2 (mild): Secondary | ICD-10-CM | POA: Diagnosis not present

## 2019-11-12 DIAGNOSIS — G43109 Migraine with aura, not intractable, without status migrainosus: Secondary | ICD-10-CM | POA: Diagnosis not present

## 2019-11-12 DIAGNOSIS — K219 Gastro-esophageal reflux disease without esophagitis: Secondary | ICD-10-CM | POA: Diagnosis not present

## 2019-12-12 DIAGNOSIS — Z9849 Cataract extraction status, unspecified eye: Secondary | ICD-10-CM | POA: Diagnosis not present

## 2019-12-12 DIAGNOSIS — H43813 Vitreous degeneration, bilateral: Secondary | ICD-10-CM | POA: Diagnosis not present

## 2019-12-12 DIAGNOSIS — H5203 Hypermetropia, bilateral: Secondary | ICD-10-CM | POA: Diagnosis not present

## 2019-12-12 DIAGNOSIS — Z961 Presence of intraocular lens: Secondary | ICD-10-CM | POA: Diagnosis not present

## 2019-12-12 DIAGNOSIS — H52223 Regular astigmatism, bilateral: Secondary | ICD-10-CM | POA: Diagnosis not present

## 2019-12-12 DIAGNOSIS — H47093 Other disorders of optic nerve, not elsewhere classified, bilateral: Secondary | ICD-10-CM | POA: Diagnosis not present

## 2019-12-12 DIAGNOSIS — H524 Presbyopia: Secondary | ICD-10-CM | POA: Diagnosis not present

## 2019-12-12 DIAGNOSIS — H11042 Peripheral pterygium, stationary, left eye: Secondary | ICD-10-CM | POA: Diagnosis not present

## 2020-02-11 DIAGNOSIS — I10 Essential (primary) hypertension: Secondary | ICD-10-CM | POA: Diagnosis not present

## 2020-02-11 DIAGNOSIS — M0589 Other rheumatoid arthritis with rheumatoid factor of multiple sites: Secondary | ICD-10-CM | POA: Diagnosis not present

## 2020-02-11 DIAGNOSIS — N182 Chronic kidney disease, stage 2 (mild): Secondary | ICD-10-CM | POA: Diagnosis not present

## 2020-02-11 DIAGNOSIS — Z131 Encounter for screening for diabetes mellitus: Secondary | ICD-10-CM | POA: Diagnosis not present

## 2020-02-11 DIAGNOSIS — G43109 Migraine with aura, not intractable, without status migrainosus: Secondary | ICD-10-CM | POA: Diagnosis not present

## 2020-02-11 DIAGNOSIS — M5441 Lumbago with sciatica, right side: Secondary | ICD-10-CM | POA: Diagnosis not present

## 2020-02-11 DIAGNOSIS — M419 Scoliosis, unspecified: Secondary | ICD-10-CM | POA: Diagnosis not present

## 2020-02-11 DIAGNOSIS — M4319 Spondylolisthesis, multiple sites in spine: Secondary | ICD-10-CM | POA: Diagnosis not present

## 2020-02-11 DIAGNOSIS — Z1331 Encounter for screening for depression: Secondary | ICD-10-CM | POA: Diagnosis not present

## 2020-02-11 DIAGNOSIS — G5602 Carpal tunnel syndrome, left upper limb: Secondary | ICD-10-CM | POA: Diagnosis not present

## 2020-02-11 DIAGNOSIS — M810 Age-related osteoporosis without current pathological fracture: Secondary | ICD-10-CM | POA: Diagnosis not present

## 2020-02-11 DIAGNOSIS — M5135 Other intervertebral disc degeneration, thoracolumbar region: Secondary | ICD-10-CM | POA: Diagnosis not present

## 2020-02-11 DIAGNOSIS — Z0000 Encounter for general adult medical examination without abnormal findings: Secondary | ICD-10-CM | POA: Diagnosis not present

## 2020-02-11 DIAGNOSIS — R079 Chest pain, unspecified: Secondary | ICD-10-CM | POA: Diagnosis not present

## 2020-02-11 DIAGNOSIS — M545 Low back pain, unspecified: Secondary | ICD-10-CM | POA: Diagnosis not present

## 2020-02-11 DIAGNOSIS — K219 Gastro-esophageal reflux disease without esophagitis: Secondary | ICD-10-CM | POA: Diagnosis not present

## 2020-02-11 DIAGNOSIS — M47814 Spondylosis without myelopathy or radiculopathy, thoracic region: Secondary | ICD-10-CM | POA: Diagnosis not present

## 2020-03-04 DIAGNOSIS — M47815 Spondylosis without myelopathy or radiculopathy, thoracolumbar region: Secondary | ICD-10-CM | POA: Diagnosis not present

## 2020-03-04 DIAGNOSIS — M47816 Spondylosis without myelopathy or radiculopathy, lumbar region: Secondary | ICD-10-CM | POA: Diagnosis not present

## 2020-03-04 DIAGNOSIS — M545 Low back pain, unspecified: Secondary | ICD-10-CM | POA: Diagnosis not present

## 2020-05-13 DIAGNOSIS — N182 Chronic kidney disease, stage 2 (mild): Secondary | ICD-10-CM | POA: Diagnosis not present

## 2020-05-13 DIAGNOSIS — K219 Gastro-esophageal reflux disease without esophagitis: Secondary | ICD-10-CM | POA: Diagnosis not present

## 2020-05-13 DIAGNOSIS — M5441 Lumbago with sciatica, right side: Secondary | ICD-10-CM | POA: Diagnosis not present

## 2020-05-13 DIAGNOSIS — M0589 Other rheumatoid arthritis with rheumatoid factor of multiple sites: Secondary | ICD-10-CM | POA: Diagnosis not present

## 2020-05-13 DIAGNOSIS — G43109 Migraine with aura, not intractable, without status migrainosus: Secondary | ICD-10-CM | POA: Diagnosis not present

## 2020-05-13 DIAGNOSIS — I10 Essential (primary) hypertension: Secondary | ICD-10-CM | POA: Diagnosis not present

## 2020-05-13 DIAGNOSIS — G5602 Carpal tunnel syndrome, left upper limb: Secondary | ICD-10-CM | POA: Diagnosis not present

## 2020-06-30 DIAGNOSIS — H6123 Impacted cerumen, bilateral: Secondary | ICD-10-CM | POA: Diagnosis not present

## 2020-06-30 DIAGNOSIS — H8110 Benign paroxysmal vertigo, unspecified ear: Secondary | ICD-10-CM | POA: Diagnosis not present

## 2020-08-14 DIAGNOSIS — I10 Essential (primary) hypertension: Secondary | ICD-10-CM | POA: Diagnosis not present

## 2020-08-14 DIAGNOSIS — N182 Chronic kidney disease, stage 2 (mild): Secondary | ICD-10-CM | POA: Diagnosis not present

## 2020-08-14 DIAGNOSIS — G43109 Migraine with aura, not intractable, without status migrainosus: Secondary | ICD-10-CM | POA: Diagnosis not present

## 2020-08-14 DIAGNOSIS — H8110 Benign paroxysmal vertigo, unspecified ear: Secondary | ICD-10-CM | POA: Diagnosis not present

## 2020-08-14 DIAGNOSIS — H6123 Impacted cerumen, bilateral: Secondary | ICD-10-CM | POA: Diagnosis not present

## 2020-08-14 DIAGNOSIS — K219 Gastro-esophageal reflux disease without esophagitis: Secondary | ICD-10-CM | POA: Diagnosis not present

## 2020-11-20 DIAGNOSIS — H8110 Benign paroxysmal vertigo, unspecified ear: Secondary | ICD-10-CM | POA: Diagnosis not present

## 2020-11-20 DIAGNOSIS — N182 Chronic kidney disease, stage 2 (mild): Secondary | ICD-10-CM | POA: Diagnosis not present

## 2020-11-20 DIAGNOSIS — M5432 Sciatica, left side: Secondary | ICD-10-CM | POA: Diagnosis not present

## 2020-11-20 DIAGNOSIS — I10 Essential (primary) hypertension: Secondary | ICD-10-CM | POA: Diagnosis not present

## 2020-11-20 DIAGNOSIS — G43109 Migraine with aura, not intractable, without status migrainosus: Secondary | ICD-10-CM | POA: Diagnosis not present

## 2020-11-20 DIAGNOSIS — K219 Gastro-esophageal reflux disease without esophagitis: Secondary | ICD-10-CM | POA: Diagnosis not present

## 2020-12-31 DIAGNOSIS — H11042 Peripheral pterygium, stationary, left eye: Secondary | ICD-10-CM | POA: Diagnosis not present

## 2020-12-31 DIAGNOSIS — H5203 Hypermetropia, bilateral: Secondary | ICD-10-CM | POA: Diagnosis not present

## 2020-12-31 DIAGNOSIS — H43813 Vitreous degeneration, bilateral: Secondary | ICD-10-CM | POA: Diagnosis not present

## 2020-12-31 DIAGNOSIS — H47093 Other disorders of optic nerve, not elsewhere classified, bilateral: Secondary | ICD-10-CM | POA: Diagnosis not present

## 2020-12-31 DIAGNOSIS — Z9849 Cataract extraction status, unspecified eye: Secondary | ICD-10-CM | POA: Diagnosis not present

## 2020-12-31 DIAGNOSIS — H52223 Regular astigmatism, bilateral: Secondary | ICD-10-CM | POA: Diagnosis not present

## 2020-12-31 DIAGNOSIS — Z961 Presence of intraocular lens: Secondary | ICD-10-CM | POA: Diagnosis not present

## 2020-12-31 DIAGNOSIS — H524 Presbyopia: Secondary | ICD-10-CM | POA: Diagnosis not present

## 2021-01-06 DIAGNOSIS — Z23 Encounter for immunization: Secondary | ICD-10-CM | POA: Diagnosis not present

## 2021-02-26 DIAGNOSIS — I10 Essential (primary) hypertension: Secondary | ICD-10-CM | POA: Diagnosis not present

## 2021-02-26 DIAGNOSIS — K219 Gastro-esophageal reflux disease without esophagitis: Secondary | ICD-10-CM | POA: Diagnosis not present

## 2021-02-26 DIAGNOSIS — G43109 Migraine with aura, not intractable, without status migrainosus: Secondary | ICD-10-CM | POA: Diagnosis not present

## 2021-02-26 DIAGNOSIS — Z1331 Encounter for screening for depression: Secondary | ICD-10-CM | POA: Diagnosis not present

## 2021-02-26 DIAGNOSIS — Z79899 Other long term (current) drug therapy: Secondary | ICD-10-CM | POA: Diagnosis not present

## 2021-02-26 DIAGNOSIS — Z0000 Encounter for general adult medical examination without abnormal findings: Secondary | ICD-10-CM | POA: Diagnosis not present

## 2021-02-26 DIAGNOSIS — M5432 Sciatica, left side: Secondary | ICD-10-CM | POA: Diagnosis not present

## 2021-02-26 DIAGNOSIS — N182 Chronic kidney disease, stage 2 (mild): Secondary | ICD-10-CM | POA: Diagnosis not present

## 2021-03-17 DIAGNOSIS — N281 Cyst of kidney, acquired: Secondary | ICD-10-CM | POA: Diagnosis not present

## 2021-03-17 DIAGNOSIS — R109 Unspecified abdominal pain: Secondary | ICD-10-CM | POA: Diagnosis not present

## 2021-03-17 DIAGNOSIS — R971 Elevated cancer antigen 125 [CA 125]: Secondary | ICD-10-CM | POA: Diagnosis not present

## 2021-05-28 DIAGNOSIS — I10 Essential (primary) hypertension: Secondary | ICD-10-CM | POA: Diagnosis not present

## 2021-05-28 DIAGNOSIS — N182 Chronic kidney disease, stage 2 (mild): Secondary | ICD-10-CM | POA: Diagnosis not present

## 2021-05-28 DIAGNOSIS — G43109 Migraine with aura, not intractable, without status migrainosus: Secondary | ICD-10-CM | POA: Diagnosis not present

## 2021-05-28 DIAGNOSIS — K219 Gastro-esophageal reflux disease without esophagitis: Secondary | ICD-10-CM | POA: Diagnosis not present

## 2021-05-28 DIAGNOSIS — M5432 Sciatica, left side: Secondary | ICD-10-CM | POA: Diagnosis not present

## 2021-07-07 DIAGNOSIS — L821 Other seborrheic keratosis: Secondary | ICD-10-CM | POA: Diagnosis not present

## 2021-07-07 DIAGNOSIS — D692 Other nonthrombocytopenic purpura: Secondary | ICD-10-CM | POA: Diagnosis not present

## 2021-07-07 DIAGNOSIS — D1801 Hemangioma of skin and subcutaneous tissue: Secondary | ICD-10-CM | POA: Diagnosis not present

## 2021-07-07 DIAGNOSIS — L820 Inflamed seborrheic keratosis: Secondary | ICD-10-CM | POA: Diagnosis not present

## 2021-09-08 DIAGNOSIS — N182 Chronic kidney disease, stage 2 (mild): Secondary | ICD-10-CM | POA: Diagnosis not present

## 2021-09-08 DIAGNOSIS — G43109 Migraine with aura, not intractable, without status migrainosus: Secondary | ICD-10-CM | POA: Diagnosis not present

## 2021-09-08 DIAGNOSIS — E7849 Other hyperlipidemia: Secondary | ICD-10-CM | POA: Diagnosis not present

## 2021-09-08 DIAGNOSIS — I10 Essential (primary) hypertension: Secondary | ICD-10-CM | POA: Diagnosis not present

## 2021-09-08 DIAGNOSIS — K219 Gastro-esophageal reflux disease without esophagitis: Secondary | ICD-10-CM | POA: Diagnosis not present

## 2021-09-08 DIAGNOSIS — M5432 Sciatica, left side: Secondary | ICD-10-CM | POA: Diagnosis not present

## 2021-09-28 DIAGNOSIS — M79662 Pain in left lower leg: Secondary | ICD-10-CM | POA: Diagnosis not present

## 2021-09-28 DIAGNOSIS — M7989 Other specified soft tissue disorders: Secondary | ICD-10-CM | POA: Diagnosis not present

## 2021-09-28 DIAGNOSIS — R600 Localized edema: Secondary | ICD-10-CM | POA: Diagnosis not present

## 2021-11-11 DIAGNOSIS — M7071 Other bursitis of hip, right hip: Secondary | ICD-10-CM | POA: Diagnosis not present

## 2021-11-11 DIAGNOSIS — M5431 Sciatica, right side: Secondary | ICD-10-CM | POA: Diagnosis not present

## 2021-11-18 DIAGNOSIS — M25551 Pain in right hip: Secondary | ICD-10-CM | POA: Diagnosis not present

## 2021-11-18 DIAGNOSIS — M545 Low back pain, unspecified: Secondary | ICD-10-CM | POA: Diagnosis not present

## 2021-11-18 DIAGNOSIS — M79604 Pain in right leg: Secondary | ICD-10-CM | POA: Diagnosis not present

## 2021-11-18 DIAGNOSIS — M47814 Spondylosis without myelopathy or radiculopathy, thoracic region: Secondary | ICD-10-CM | POA: Diagnosis not present

## 2021-11-18 DIAGNOSIS — Z23 Encounter for immunization: Secondary | ICD-10-CM | POA: Diagnosis not present

## 2021-11-18 DIAGNOSIS — M2578 Osteophyte, vertebrae: Secondary | ICD-10-CM | POA: Diagnosis not present

## 2021-11-18 DIAGNOSIS — M5136 Other intervertebral disc degeneration, lumbar region: Secondary | ICD-10-CM | POA: Diagnosis not present

## 2021-11-18 DIAGNOSIS — M7989 Other specified soft tissue disorders: Secondary | ICD-10-CM | POA: Diagnosis not present

## 2021-11-18 DIAGNOSIS — M549 Dorsalgia, unspecified: Secondary | ICD-10-CM | POA: Diagnosis not present

## 2021-11-18 DIAGNOSIS — G8929 Other chronic pain: Secondary | ICD-10-CM | POA: Diagnosis not present

## 2021-11-18 DIAGNOSIS — M47816 Spondylosis without myelopathy or radiculopathy, lumbar region: Secondary | ICD-10-CM | POA: Diagnosis not present

## 2021-12-15 DIAGNOSIS — M5432 Sciatica, left side: Secondary | ICD-10-CM | POA: Diagnosis not present

## 2021-12-23 DIAGNOSIS — M545 Low back pain, unspecified: Secondary | ICD-10-CM | POA: Diagnosis not present

## 2021-12-23 DIAGNOSIS — R600 Localized edema: Secondary | ICD-10-CM | POA: Diagnosis not present

## 2021-12-31 ENCOUNTER — Encounter: Payer: Self-pay | Admitting: Orthopaedic Surgery

## 2021-12-31 ENCOUNTER — Ambulatory Visit (INDEPENDENT_AMBULATORY_CARE_PROVIDER_SITE_OTHER): Payer: Medicare Other | Admitting: Orthopaedic Surgery

## 2021-12-31 VITALS — Ht 60.0 in | Wt 106.0 lb

## 2021-12-31 DIAGNOSIS — R5383 Other fatigue: Secondary | ICD-10-CM | POA: Diagnosis not present

## 2021-12-31 DIAGNOSIS — M8589 Other specified disorders of bone density and structure, multiple sites: Secondary | ICD-10-CM | POA: Diagnosis not present

## 2021-12-31 DIAGNOSIS — Z79899 Other long term (current) drug therapy: Secondary | ICD-10-CM | POA: Diagnosis not present

## 2021-12-31 DIAGNOSIS — M8448XA Pathological fracture, other site, initial encounter for fracture: Secondary | ICD-10-CM

## 2021-12-31 DIAGNOSIS — M810 Age-related osteoporosis without current pathological fracture: Secondary | ICD-10-CM | POA: Diagnosis not present

## 2021-12-31 DIAGNOSIS — M48062 Spinal stenosis, lumbar region with neurogenic claudication: Secondary | ICD-10-CM | POA: Diagnosis not present

## 2021-12-31 DIAGNOSIS — M48061 Spinal stenosis, lumbar region without neurogenic claudication: Secondary | ICD-10-CM | POA: Insufficient documentation

## 2021-12-31 DIAGNOSIS — E559 Vitamin D deficiency, unspecified: Secondary | ICD-10-CM | POA: Diagnosis not present

## 2021-12-31 NOTE — Progress Notes (Signed)
Office Visit Note   Patient: Joy Freeman           Date of Birth: 1941/01/26           MRN: [id number] Visit Date: 12/31/2021              Requested by: Neale Burly, MD [place],  [place] [id number] PCP: Neale Burly, MD   Assessment & Plan: Visit Diagnoses:  1. Spinal stenosis of lumbar region with neurogenic claudication   2. Sacral insufficiency fracture, initial encounter     Plan: Patient with history of fractures osteoporosis.  We will send her to Dr. Layne Benton for the osteoporosis clinic.  She has L4-5 stenosis which we reviewed likely given her pain symptoms.  Husband had been in the hospital awake she had to visit him on a daily basis and was doing a lot more walking.  We will set up for single epidural at the L4-5 stenosis level to try to get her some pain relief.  She can follow-up with me in 8 weeks.  Follow-Up Instructions: No follow-ups on file.   Orders:  No orders of the defined types were placed in this encounter.  No orders of the defined types were placed in this encounter.     Procedures: No procedures performed   Clinical Data: No additional findings.   Subjective: Chief Complaint  Patient presents with   Lower Back - Pain   Left Hip - Pain   Right Hip - Pain    HPI 81 year old female new patient referred by Dr.Hasanaj with left and right buttocks pain which tends to vary.  She had tibial plateau fracture in the past had a recent MRI within the last week which showed acute versus subacute sacral insufficiency fracture.  Patient ambulates with a cane she has significant lumbar disc degeneration with curvature.  Bone density test last done 2008 showed osteoporosis.  No new studies since then.Pain has been much worse the last 4 months.   Review of Systems positive for GERD, hypertension.  Previous tibial plateau fracture fixed by Dr. Ginette Pitman.   Objective: Vital Signs: Ht 5' (1.524 m)   Wt 106 lb (48.1 kg)   BMI 20.70 kg/m    Physical Exam Constitutional:      Appearance: She is well-developed.  HENT:     Head: Normocephalic.     Right Ear: External ear normal.     Left Ear: External ear normal. There is no impacted cerumen.  Eyes:     Pupils: Pupils are equal, round, and reactive to light.  Neck:     Thyroid: No thyromegaly.     Trachea: No tracheal deviation.  Cardiovascular:     Rate and Rhythm: Normal rate.  Pulmonary:     Effort: Pulmonary effort is normal.  Abdominal:     Palpations: Abdomen is soft.  Musculoskeletal:     Cervical back: No rigidity.  Skin:    General: Skin is warm and dry.  Neurological:     Mental Status: She is alert and oriented to person, place, and time.  Psychiatric:        Behavior: Behavior normal.     Ortho Exam patient has right left sciatic notch tenderness negative straight leg raising 90 degrees.  Some trochanteric bursal tenderness.  Some tenderness palpation over the sacrum.  Specialty Comments:  No specialty comments available.  Imaging: No results found.   PMFS History: Patient Active Problem List   Diagnosis  Date Noted   Spinal stenosis of lumbar region 12/31/2021   Sacral insufficiency fracture 12/31/2021   History of migraine 05/10/2016   Hypertension    GERD (gastroesophageal reflux disease)    Tibial plateau fracture 05/07/2016   Past Medical History:  Diagnosis Date   Anemia    as a younger woman   Arthritis    osteoarthritis, psoriatic arthritis, rheumatoid arthritis   Family history of adverse reaction to anesthesia    mother had N/V   GERD (gastroesophageal reflux disease)    Headache    migraines   History of migraine 05/10/2016   Hypertension    Mitral valve prolapse     Family History  Problem Relation Age of Onset   Hypertension Mother    Transient ischemic attack Mother    Dementia Mother    COPD Father     Past Surgical History:  Procedure Laterality Date   COLONOSCOPY     ORIF TIBIA PLATEAU Left 05/07/2016    Procedure: OPEN REDUCTION INTERNAL FIXATION (ORIF LEFT TIBIAL PLATEAU;  Surgeon: Altamese Lakeland, MD;  Location: [facility];  Service: Orthopedics;  Laterality: Left;   TRIGGER FINGER RELEASE Left    thumb   Social History   Occupational History   Not on file  Tobacco Use   Smoking status: Never   Smokeless tobacco: Never  Substance and Sexual Activity   Alcohol use: No   Drug use: No   Sexual activity: Not on file

## 2021-12-31 NOTE — Addendum Note (Signed)
Addended by: Meyer Cory on: 12/31/2021 03:19 PM   Modules accepted: Orders

## 2022-01-18 ENCOUNTER — Ambulatory Visit (INDEPENDENT_AMBULATORY_CARE_PROVIDER_SITE_OTHER): Payer: Medicare Other | Admitting: Physical Medicine and Rehabilitation

## 2022-01-18 ENCOUNTER — Ambulatory Visit: Payer: Self-pay

## 2022-01-18 DIAGNOSIS — M48062 Spinal stenosis, lumbar region with neurogenic claudication: Secondary | ICD-10-CM

## 2022-01-18 DIAGNOSIS — M5416 Radiculopathy, lumbar region: Secondary | ICD-10-CM | POA: Diagnosis not present

## 2022-01-18 MED ORDER — METHYLPREDNISOLONE ACETATE 80 MG/ML IJ SUSP
40.0000 mg | Freq: Once | INTRAMUSCULAR | Status: AC
  Administered 2022-01-18: 40 mg

## 2022-01-18 NOTE — Progress Notes (Signed)
Numeric Pain Rating Scale and Functional Assessment Average Pain 5   In the last MONTH (on 0-10 scale) has pain interfered with the following?  1. General activity like being  able to carry out your everyday physical activities such as walking, climbing stairs, carrying groceries, or moving a chair?  Rating(5) Lower back pain with radiculopathy to the right hip.  +Driver, -BT, -Dye Allergies.

## 2022-01-18 NOTE — Procedures (Signed)
Lumbar Epidural Steroid Injection - Interlaminar Approach with Fluoroscopic Guidance  Patient: Joy Freeman      Date of Birth: 10/21/1940 MRN: [id number] PCP: Neale Burly, MD      Visit Date: 01/18/2022   Universal Protocol:     Consent Given By: the patient  Position: PRONE  Additional Comments: Vital signs were monitored before and after the procedure. Patient was prepped and draped in the usual sterile fashion. The correct patient, procedure, and site was verified.   Injection Procedure Details:   Procedure diagnoses: Lumbar radiculopathy [M54.16]   Meds Administered:  Meds ordered this encounter  Medications   methylPREDNISolone acetate (DEPO-MEDROL) injection 40 mg     Laterality: Left  Location/Site:  L4-5  Needle: 3.5 in., 20 ga. Tuohy  Needle Placement: Paramedian epidural  Findings:   -Comments: Excellent flow of contrast into the epidural space.  Procedure Details: Using a paramedian approach from the side mentioned above, the region overlying the inferior lamina was localized under fluoroscopic visualization and the soft tissues overlying this structure were infiltrated with 4 ml. of 1% Lidocaine without Epinephrine. The Tuohy needle was inserted into the epidural space using a paramedian approach.   The epidural space was localized using loss of resistance along with counter oblique bi-planar fluoroscopic views.  After negative aspirate for air, blood, and CSF, a 2 ml. volume of Isovue-250 was injected into the epidural space and the flow of contrast was observed. Radiographs were obtained for documentation purposes.    The injectate was administered into the level noted above.   Additional Comments:  The patient tolerated the procedure well Dressing: 2 x 2 sterile gauze and Band-Aid    Post-procedure details: Patient was observed during the procedure. Post-procedure instructions were reviewed.  Patient left the clinic in stable condition.

## 2022-01-18 NOTE — Patient Instructions (Signed)

## 2022-01-18 NOTE — Progress Notes (Signed)
Joy Freeman - 81 y.o. female MRN [id number]  Date of birth: 1940/03/31  Office Visit Note: Visit Date: 01/18/2022 PCP: Neale Burly, MD Referred by: Neale Burly, MD  Subjective: Chief Complaint  Patient presents with   Lower Back - Pain   HPI:  Joy Freeman is a 81 y.o. female who comes in today at the request of Dr. Rodell Perna for planned Left L4-5 Lumbar Interlaminar epidural steroid injection with fluoroscopic guidance.  The patient has failed conservative care including home exercise, medications, time and activity modification.  This injection will be diagnostic and hopefully therapeutic.  Please see requesting physician notes for further details and justification.   ROS Otherwise per HPI.  Assessment & Plan: Visit Diagnoses:    ICD-10-CM   1. Lumbar radiculopathy  M54.16 XR C-ARM NO REPORT    Epidural Steroid injection    methylPREDNISolone acetate (DEPO-MEDROL) injection 40 mg    2. 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   Meds & Orders:  Meds ordered this encounter  Medications   methylPREDNISolone acetate (DEPO-MEDROL) injection 40 mg    Orders Placed This Encounter  Procedures   XR C-ARM NO REPORT   Epidural Steroid injection    Follow-up: Return for visit to requesting provider as needed.   Procedures: No procedures performed  Lumbar Epidural Steroid Injection - Interlaminar Approach with Fluoroscopic Guidance  Patient: Joy Freeman      Date of Birth: 1940/03/06 MRN: [id number] PCP: Neale Burly, MD      Visit Date: 01/18/2022   Universal Protocol:     Consent Given By: the patient  Position: PRONE  Additional Comments: Vital signs were monitored before and after the procedure. Patient was prepped and draped in the usual sterile fashion. The correct patient, procedure, and site was verified.   Injection Procedure Details:   Procedure diagnoses: Lumbar  radiculopathy [M54.16]   Meds Administered:  Meds ordered this encounter  Medications   methylPREDNISolone acetate (DEPO-MEDROL) injection 40 mg     Laterality: Left  Location/Site:  L4-5  Needle: 3.5 in., 20 ga. Tuohy  Needle Placement: Paramedian epidural  Findings:   -Comments: Excellent flow of contrast into the epidural space.  Procedure Details: Using a paramedian approach from the side mentioned above, the region overlying the inferior lamina was localized under fluoroscopic visualization and the soft tissues overlying this structure were infiltrated with 4 ml. of 1% Lidocaine without Epinephrine. The Tuohy needle was inserted into the epidural space using a paramedian approach.   The epidural space was localized using loss of resistance along with counter oblique bi-planar fluoroscopic views.  After negative aspirate for air, blood, and CSF, a 2 ml. volume of Isovue-250 was injected into the epidural space and the flow of contrast was observed. Radiographs were obtained for documentation purposes.    The injectate was administered into the level noted above.   Additional Comments:  The patient tolerated the procedure well Dressing: 2 x 2 sterile gauze and Band-Aid    Post-procedure details: Patient was observed during the procedure. Post-procedure instructions were reviewed.  Patient left the clinic in stable condition.   Clinical History: MRI LUMBAR SPINE WITHOUT CONTRAST   TECHNIQUE:  Multiplanar, multisequence MR imaging of the lumbar spine was  performed. No intravenous contrast was administered.   COMPARISON:  None Available.   FINDINGS:  Segmentation: 5 lumbar type vertebrae   Alignment:  Levoscoliosis and hyperlordosis.   Vertebrae: Marrow edema in both sacral ala and crossing the S2 body,  partially covered. Hypointense fracture planes are better  demonstrated at the right sacral ala. No aggressive bone lesion is  noted. Small hypointensity at  the L2 body is not darker than disc  space.   Conus medullaris and cauda equina: Conus extends to the L1-2 level.  Conus and cauda equina appear normal.   Paraspinal and other soft tissues: Negative for perispinal mass or  inflammation.   Disc levels:   T12- L1: Disc collapse and endplate degeneration with central  protrusion.   L1-L2: Disc collapse and endplate degeneration with disc bulging and  right paracentral protrusion.   L2-L3: Disc collapse and endplate degeneration eccentric towards the  right where there is endplate and facet spurring.   L3-L4: Disc narrowing and endplate degeneration with disc bulging.  Degenerative facet spurring on both sides. Moderate bilateral  foraminal narrowing   L4-L5: Disc narrowing and bulging with left foraminal protrusion.  Moderate spinal stenosis. Moderate left foraminal narrowing   L5-S1:Mild disc bulging and facet spurring.   IMPRESSION:  1. Acute or subacute sacral insufficiency fractures.  2. Advanced lumbar spine degeneration with scoliosis.  3. 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    Electronically Signed    By: Jorje Guild M.D.    On: 12/26/2021 04:57     Objective:  VS:  HT:    WT:   BMI:     BP:   HR: bpm  TEMP: ( )  RESP:  Physical Exam Vitals and nursing note reviewed.  Constitutional:      General: She is not in acute distress.    Appearance: Normal appearance. She is not ill-appearing.  HENT:     Head: Normocephalic and atraumatic.     Right Ear: External ear normal.     Left Ear: External ear normal.  Eyes:     Extraocular Movements: Extraocular movements intact.  Cardiovascular:     Rate and Rhythm: Normal rate.     Pulses: Normal pulses.  Pulmonary:     Effort: Pulmonary effort is normal. No respiratory distress.  Abdominal:     General: There is no distension.     Palpations: Abdomen is soft.  Musculoskeletal:        General: Tenderness present.     Cervical back: Neck  supple.     Right lower leg: No edema.     Left lower leg: No edema.     Comments: Patient has good distal strength with no pain over the greater trochanters.  No clonus or focal weakness.  Skin:    Findings: No erythema, lesion or rash.  Neurological:     General: No focal deficit present.     Mental Status: She is alert and oriented to person, place, and time.     Sensory: No sensory deficit.     Motor: No weakness or abnormal muscle tone.     Coordination: Coordination normal.  Psychiatric:        Mood and Affect: Mood normal.        Behavior: Behavior normal.      Imaging: XR C-ARM NO REPORT  Result Date: 01/18/2022 Please see Notes tab for imaging impression.

## 2022-01-26 ENCOUNTER — Telehealth: Payer: Self-pay | Admitting: Physical Medicine and Rehabilitation

## 2022-01-26 NOTE — Telephone Encounter (Signed)
Patient states that the injection in her back has not helped and she would like to discuss maybe getting another injection or finding another solution. Please advise..[phone]

## 2022-01-27 NOTE — Telephone Encounter (Signed)
Spoke with patient and she stated the pain is coming and going and it goes down hill after 11 am. She states the pain is worse on some days. She wants to know what sh can do now. Please advise.

## 2022-01-29 NOTE — Telephone Encounter (Signed)
Left voicemail to return call to clinic.

## 2022-02-01 DIAGNOSIS — M5432 Sciatica, left side: Secondary | ICD-10-CM | POA: Diagnosis not present

## 2022-02-01 DIAGNOSIS — D72829 Elevated white blood cell count, unspecified: Secondary | ICD-10-CM | POA: Diagnosis not present

## 2022-02-03 NOTE — Telephone Encounter (Signed)
Spoke with patient and she stated she she has felt a lot better in the last 3-4 days. Informed her that if the pain returns, to follow up with Dr. Lorin Mercy

## 2022-02-12 DIAGNOSIS — M1990 Unspecified osteoarthritis, unspecified site: Secondary | ICD-10-CM | POA: Diagnosis not present

## 2022-02-12 DIAGNOSIS — Z7982 Long term (current) use of aspirin: Secondary | ICD-10-CM | POA: Diagnosis not present

## 2022-02-12 DIAGNOSIS — I951 Orthostatic hypotension: Secondary | ICD-10-CM | POA: Diagnosis not present

## 2022-02-12 DIAGNOSIS — D75839 Thrombocytosis, unspecified: Secondary | ICD-10-CM | POA: Diagnosis not present

## 2022-02-12 DIAGNOSIS — K648 Other hemorrhoids: Secondary | ICD-10-CM | POA: Diagnosis not present

## 2022-02-12 DIAGNOSIS — K219 Gastro-esophageal reflux disease without esophagitis: Secondary | ICD-10-CM | POA: Diagnosis not present

## 2022-02-12 DIAGNOSIS — R799 Abnormal finding of blood chemistry, unspecified: Secondary | ICD-10-CM | POA: Diagnosis not present

## 2022-02-12 DIAGNOSIS — D72821 Monocytosis (symptomatic): Secondary | ICD-10-CM | POA: Diagnosis not present

## 2022-02-12 DIAGNOSIS — Z79899 Other long term (current) drug therapy: Secondary | ICD-10-CM | POA: Diagnosis not present

## 2022-02-12 DIAGNOSIS — I10 Essential (primary) hypertension: Secondary | ICD-10-CM | POA: Diagnosis not present

## 2022-02-17 DIAGNOSIS — K648 Other hemorrhoids: Secondary | ICD-10-CM | POA: Diagnosis not present

## 2022-02-17 DIAGNOSIS — K219 Gastro-esophageal reflux disease without esophagitis: Secondary | ICD-10-CM | POA: Diagnosis not present

## 2022-02-17 DIAGNOSIS — G43909 Migraine, unspecified, not intractable, without status migrainosus: Secondary | ICD-10-CM | POA: Diagnosis not present

## 2022-02-17 DIAGNOSIS — R799 Abnormal finding of blood chemistry, unspecified: Secondary | ICD-10-CM | POA: Diagnosis not present

## 2022-02-17 DIAGNOSIS — I10 Essential (primary) hypertension: Secondary | ICD-10-CM | POA: Diagnosis not present

## 2022-02-17 DIAGNOSIS — I951 Orthostatic hypotension: Secondary | ICD-10-CM | POA: Diagnosis not present

## 2022-02-17 DIAGNOSIS — M5432 Sciatica, left side: Secondary | ICD-10-CM | POA: Diagnosis not present

## 2022-03-04 DIAGNOSIS — D72821 Monocytosis (symptomatic): Secondary | ICD-10-CM | POA: Diagnosis not present

## 2022-03-04 DIAGNOSIS — D75839 Thrombocytosis, unspecified: Secondary | ICD-10-CM | POA: Diagnosis not present

## 2022-04-21 DIAGNOSIS — H524 Presbyopia: Secondary | ICD-10-CM | POA: Diagnosis not present

## 2022-04-21 DIAGNOSIS — H47093 Other disorders of optic nerve, not elsewhere classified, bilateral: Secondary | ICD-10-CM | POA: Diagnosis not present

## 2022-04-21 DIAGNOSIS — H21263 Iris atrophy (essential) (progressive), bilateral: Secondary | ICD-10-CM | POA: Diagnosis not present

## 2022-04-21 DIAGNOSIS — H43813 Vitreous degeneration, bilateral: Secondary | ICD-10-CM | POA: Diagnosis not present

## 2022-04-21 DIAGNOSIS — H52223 Regular astigmatism, bilateral: Secondary | ICD-10-CM | POA: Diagnosis not present

## 2022-04-21 DIAGNOSIS — Z961 Presence of intraocular lens: Secondary | ICD-10-CM | POA: Diagnosis not present

## 2022-04-21 DIAGNOSIS — Z9849 Cataract extraction status, unspecified eye: Secondary | ICD-10-CM | POA: Diagnosis not present

## 2022-04-21 DIAGNOSIS — H11042 Peripheral pterygium, stationary, left eye: Secondary | ICD-10-CM | POA: Diagnosis not present

## 2022-04-21 DIAGNOSIS — H5203 Hypermetropia, bilateral: Secondary | ICD-10-CM | POA: Diagnosis not present

## 2022-05-18 DIAGNOSIS — M0589 Other rheumatoid arthritis with rheumatoid factor of multiple sites: Secondary | ICD-10-CM | POA: Diagnosis not present

## 2022-05-18 DIAGNOSIS — M810 Age-related osteoporosis without current pathological fracture: Secondary | ICD-10-CM | POA: Diagnosis not present

## 2022-05-18 DIAGNOSIS — M5432 Sciatica, left side: Secondary | ICD-10-CM | POA: Diagnosis not present

## 2022-05-18 DIAGNOSIS — Z1389 Encounter for screening for other disorder: Secondary | ICD-10-CM | POA: Diagnosis not present

## 2022-05-18 DIAGNOSIS — N182 Chronic kidney disease, stage 2 (mild): Secondary | ICD-10-CM | POA: Diagnosis not present

## 2022-05-18 DIAGNOSIS — E7849 Other hyperlipidemia: Secondary | ICD-10-CM | POA: Diagnosis not present

## 2022-05-18 DIAGNOSIS — G43109 Migraine with aura, not intractable, without status migrainosus: Secondary | ICD-10-CM | POA: Diagnosis not present

## 2022-05-18 DIAGNOSIS — I10 Essential (primary) hypertension: Secondary | ICD-10-CM | POA: Diagnosis not present

## 2022-05-18 DIAGNOSIS — Z0000 Encounter for general adult medical examination without abnormal findings: Secondary | ICD-10-CM | POA: Diagnosis not present

## 2022-05-18 DIAGNOSIS — K219 Gastro-esophageal reflux disease without esophagitis: Secondary | ICD-10-CM | POA: Diagnosis not present

## 2022-07-07 DIAGNOSIS — L821 Other seborrheic keratosis: Secondary | ICD-10-CM | POA: Diagnosis not present

## 2022-07-07 DIAGNOSIS — L578 Other skin changes due to chronic exposure to nonionizing radiation: Secondary | ICD-10-CM | POA: Diagnosis not present

## 2022-07-07 DIAGNOSIS — D692 Other nonthrombocytopenic purpura: Secondary | ICD-10-CM | POA: Diagnosis not present

## 2022-07-07 DIAGNOSIS — D1801 Hemangioma of skin and subcutaneous tissue: Secondary | ICD-10-CM | POA: Diagnosis not present

## 2022-07-22 ENCOUNTER — Other Ambulatory Visit: Payer: Self-pay | Admitting: Physical Medicine and Rehabilitation

## 2022-07-22 ENCOUNTER — Telehealth: Payer: Self-pay | Admitting: Radiology

## 2022-07-22 DIAGNOSIS — M48062 Spinal stenosis, lumbar region with neurogenic claudication: Secondary | ICD-10-CM

## 2022-07-22 DIAGNOSIS — M5416 Radiculopathy, lumbar region: Secondary | ICD-10-CM

## 2022-07-22 NOTE — Telephone Encounter (Signed)
I called patient and advised, referral entered. Explained we will work to get insurance authorization and someone will call her to schedule.

## 2022-07-22 NOTE — Telephone Encounter (Signed)
Patient called [facility] office requesting referral to Dr. Alvester Morin to repeat ESI that was done last year. She states that she got great relief and feels it is time to get another injection. Please advise.   CB for patient [phone]

## 2022-07-22 NOTE — Telephone Encounter (Signed)
Referral entered  

## 2022-08-11 ENCOUNTER — Other Ambulatory Visit: Payer: Self-pay

## 2022-08-11 ENCOUNTER — Ambulatory Visit (INDEPENDENT_AMBULATORY_CARE_PROVIDER_SITE_OTHER): Payer: Medicare Other | Admitting: Physical Medicine and Rehabilitation

## 2022-08-11 VITALS — BP 152/80 | HR 69

## 2022-08-11 DIAGNOSIS — M5416 Radiculopathy, lumbar region: Secondary | ICD-10-CM

## 2022-08-11 MED ORDER — METHYLPREDNISOLONE ACETATE 80 MG/ML IJ SUSP
80.0000 mg | Freq: Once | INTRAMUSCULAR | Status: AC
  Administered 2022-08-11: 80 mg

## 2022-08-11 NOTE — Patient Instructions (Signed)

## 2022-08-11 NOTE — Progress Notes (Signed)
Functional Pain Scale - descriptive words and definitions  Moderate (4)   Constantly aware of pain, can complete ADLs with modification/sleep marginally affected at times/passive distraction is of no use, but active distraction gives some relief. Moderate range order  Average Pain 6   +Driver, -BT, -Dye Allergies.  Lower back pain on left side that radiates in the left leg to the knee

## 2022-08-17 DIAGNOSIS — N182 Chronic kidney disease, stage 2 (mild): Secondary | ICD-10-CM | POA: Diagnosis not present

## 2022-08-17 DIAGNOSIS — M810 Age-related osteoporosis without current pathological fracture: Secondary | ICD-10-CM | POA: Diagnosis not present

## 2022-08-17 DIAGNOSIS — Z1389 Encounter for screening for other disorder: Secondary | ICD-10-CM | POA: Diagnosis not present

## 2022-08-17 DIAGNOSIS — M5432 Sciatica, left side: Secondary | ICD-10-CM | POA: Diagnosis not present

## 2022-08-17 DIAGNOSIS — G43109 Migraine with aura, not intractable, without status migrainosus: Secondary | ICD-10-CM | POA: Diagnosis not present

## 2022-08-17 DIAGNOSIS — M0589 Other rheumatoid arthritis with rheumatoid factor of multiple sites: Secondary | ICD-10-CM | POA: Diagnosis not present

## 2022-08-17 DIAGNOSIS — Z0000 Encounter for general adult medical examination without abnormal findings: Secondary | ICD-10-CM | POA: Diagnosis not present

## 2022-08-17 DIAGNOSIS — K219 Gastro-esophageal reflux disease without esophagitis: Secondary | ICD-10-CM | POA: Diagnosis not present

## 2022-08-17 DIAGNOSIS — E7849 Other hyperlipidemia: Secondary | ICD-10-CM | POA: Diagnosis not present

## 2022-08-17 DIAGNOSIS — I10 Essential (primary) hypertension: Secondary | ICD-10-CM | POA: Diagnosis not present

## 2022-08-18 NOTE — Progress Notes (Signed)
Joy Freeman - 82 y.o. female MRN [id number]  Date of birth: 10-Jul-1940  Office Visit Note: Visit Date: 08/11/2022 PCP: Toma Deiters, MD Referred by: Toma Deiters, MD  Subjective: Chief Complaint  Patient presents with   Lower Back - Pain   HPI:  Joy Freeman is a 82 y.o. female who comes in today for planned repeat Left L4-5  Lumbar Interlaminar epidural steroid injection with fluoroscopic guidance.  The patient has failed conservative care including home exercise, medications, time and activity modification.  This injection will be diagnostic and hopefully therapeutic.  Please see requesting physician notes for further details and justification. Patient received more than 50% pain relief from prior injection.   Referring: Dr. Annell Greening   ROS Otherwise per HPI.  Assessment & Plan: Visit Diagnoses:    ICD-10-CM   1. Lumbar radiculopathy  M54.16 XR C-ARM NO REPORT    Epidural Steroid injection    methylPREDNISolone acetate (DEPO-MEDROL) injection 80 mg      Plan: No additional findings.   Meds & Orders:  Meds ordered this encounter  Medications   methylPREDNISolone acetate (DEPO-MEDROL) injection 80 mg    Orders Placed This Encounter  Procedures   XR C-ARM NO REPORT   Epidural Steroid injection    Follow-up: Return for visit to requesting provider as needed.   Procedures: No procedures performed  Lumbar Epidural Steroid Injection - Interlaminar Approach with Fluoroscopic Guidance  Patient: Joy Freeman      Date of Birth: [birth year] MRN: [id number] PCP: Toma Deiters, MD      Visit Date: 08/11/2022   Universal Protocol:     Consent Given By: the patient  Position: PRONE  Additional Comments: Vital signs were monitored before and after the procedure. Patient was prepped and draped in the usual sterile fashion. The correct patient, procedure, and site was verified.   Injection Procedure Details:   Procedure diagnoses: Lumbar radiculopathy  [M54.16]   Meds Administered:  Meds ordered this encounter  Medications   methylPREDNISolone acetate (DEPO-MEDROL) injection 80 mg     Laterality: Left  Location/Site:  L4-5  Needle: 3.5 in., 20 ga. Tuohy  Needle Placement: Paramedian epidural  Findings:   -Comments: Excellent flow of contrast into the epidural space.  Procedure Details: Using a paramedian approach from the side mentioned above, the region overlying the inferior lamina was localized under fluoroscopic visualization and the soft tissues overlying this structure were infiltrated with 4 ml. of 1% Lidocaine without Epinephrine. The Tuohy needle was inserted into the epidural space using a paramedian approach.   The epidural space was localized using loss of resistance along with counter oblique bi-planar fluoroscopic views.  After negative aspirate for air, blood, and CSF, a 2 ml. volume of Isovue-250 was injected into the epidural space and the flow of contrast was observed. Radiographs were obtained for documentation purposes.    The injectate was administered into the level noted above.   Additional Comments:  No complications occurred Dressing: 2 x 2 sterile gauze and Band-Aid    Post-procedure details: Patient was observed during the procedure. Post-procedure instructions were reviewed.  Patient left the clinic in stable condition.   Clinical History: MRI LUMBAR SPINE WITHOUT CONTRAST   TECHNIQUE:  Multiplanar, multisequence MR imaging of the lumbar spine was  performed. No intravenous contrast was administered.   COMPARISON:  None Available.   FINDINGS:  Segmentation: 5 lumbar type vertebrae   Alignment:  Levoscoliosis and hyperlordosis.   Vertebrae:  Marrow edema in both sacral ala and crossing the S2 body,  partially covered. Hypointense fracture planes are better  demonstrated at the right sacral ala. No aggressive bone lesion is  noted. Small hypointensity at the L2 body is not darker than  disc  space.   Conus medullaris and cauda equina: Conus extends to the L1-2 level.  Conus and cauda equina appear normal.   Paraspinal and other soft tissues: Negative for perispinal mass or  inflammation.   Disc levels:   T12- L1: Disc collapse and endplate degeneration with central  protrusion.   L1-L2: Disc collapse and endplate degeneration with disc bulging and  right paracentral protrusion.   L2-L3: Disc collapse and endplate degeneration eccentric towards the  right where there is endplate and facet spurring.   L3-L4: Disc narrowing and endplate degeneration with disc bulging.  Degenerative facet spurring on both sides. Moderate bilateral  foraminal narrowing   L4-L5: Disc narrowing and bulging with left foraminal protrusion.  Moderate spinal stenosis. Moderate left foraminal narrowing   L5-S1:Mild disc bulging and facet spurring.   IMPRESSION:  1. Acute or subacute sacral insufficiency fractures.  2. Advanced lumbar spine degeneration with scoliosis.  3. 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    Electronically Signed    By: Tiburcio Pea M.D.    On: 12/26/2021 04:57     Objective:  VS:  HT:    WT:   BMI:     BP:(!) 152/80  HR:69bpm  TEMP: ( )  RESP:  Physical Exam Vitals and nursing note reviewed.  Constitutional:      General: She is not in acute distress.    Appearance: Normal appearance. She is not ill-appearing.  HENT:     Head: Normocephalic and atraumatic.     Right Ear: External ear normal.     Left Ear: External ear normal.  Eyes:     Extraocular Movements: Extraocular movements intact.  Cardiovascular:     Rate and Rhythm: Normal rate.     Pulses: Normal pulses.  Pulmonary:     Effort: Pulmonary effort is normal. No respiratory distress.  Abdominal:     General: There is no distension.     Palpations: Abdomen is soft.  Musculoskeletal:        General: Tenderness present.     Cervical back: Neck supple.     Right lower  leg: No edema.     Left lower leg: No edema.     Comments: Patient has good distal strength with no pain over the greater trochanters.  No clonus or focal weakness.  Skin:    Findings: No erythema, lesion or rash.  Neurological:     General: No focal deficit present.     Mental Status: She is alert and oriented to person, place, and time.     Sensory: No sensory deficit.     Motor: No weakness or abnormal muscle tone.     Coordination: Coordination normal.  Psychiatric:        Mood and Affect: Mood normal.        Behavior: Behavior normal.      Imaging: No results found.

## 2022-08-18 NOTE — Procedures (Signed)
Lumbar Epidural Steroid Injection - Interlaminar Approach with Fluoroscopic Guidance  Patient: Joy Freeman      Date of Birth: June 01, 1940 MRN: [id number] PCP: Toma Deiters, MD      Visit Date: 08/11/2022   Universal Protocol:     Consent Given By: the patient  Position: PRONE  Additional Comments: Vital signs were monitored before and after the procedure. Patient was prepped and draped in the usual sterile fashion. The correct patient, procedure, and site was verified.   Injection Procedure Details:   Procedure diagnoses: Lumbar radiculopathy [M54.16]   Meds Administered:  Meds ordered this encounter  Medications   methylPREDNISolone acetate (DEPO-MEDROL) injection 80 mg     Laterality: Left  Location/Site:  L4-5  Needle: 3.5 in., 20 ga. Tuohy  Needle Placement: Paramedian epidural  Findings:   -Comments: Excellent flow of contrast into the epidural space.  Procedure Details: Using a paramedian approach from the side mentioned above, the region overlying the inferior lamina was localized under fluoroscopic visualization and the soft tissues overlying this structure were infiltrated with 4 ml. of 1% Lidocaine without Epinephrine. The Tuohy needle was inserted into the epidural space using a paramedian approach.   The epidural space was localized using loss of resistance along with counter oblique bi-planar fluoroscopic views.  After negative aspirate for air, blood, and CSF, a 2 ml. volume of Isovue-250 was injected into the epidural space and the flow of contrast was observed. Radiographs were obtained for documentation purposes.    The injectate was administered into the level noted above.   Additional Comments:  No complications occurred Dressing: 2 x 2 sterile gauze and Band-Aid    Post-procedure details: Patient was observed during the procedure. Post-procedure instructions were reviewed.  Patient left the clinic in stable condition.

## 2022-10-05 DIAGNOSIS — Z780 Asymptomatic menopausal state: Secondary | ICD-10-CM | POA: Diagnosis not present

## 2022-10-05 DIAGNOSIS — M810 Age-related osteoporosis without current pathological fracture: Secondary | ICD-10-CM | POA: Diagnosis not present

## 2022-11-16 DIAGNOSIS — M0589 Other rheumatoid arthritis with rheumatoid factor of multiple sites: Secondary | ICD-10-CM | POA: Diagnosis not present

## 2022-11-16 DIAGNOSIS — M810 Age-related osteoporosis without current pathological fracture: Secondary | ICD-10-CM | POA: Diagnosis not present

## 2022-11-16 DIAGNOSIS — M5432 Sciatica, left side: Secondary | ICD-10-CM | POA: Diagnosis not present

## 2022-11-16 DIAGNOSIS — N182 Chronic kidney disease, stage 2 (mild): Secondary | ICD-10-CM | POA: Diagnosis not present

## 2022-11-16 DIAGNOSIS — E7849 Other hyperlipidemia: Secondary | ICD-10-CM | POA: Diagnosis not present

## 2022-11-16 DIAGNOSIS — I10 Essential (primary) hypertension: Secondary | ICD-10-CM | POA: Diagnosis not present

## 2022-11-16 DIAGNOSIS — G43109 Migraine with aura, not intractable, without status migrainosus: Secondary | ICD-10-CM | POA: Diagnosis not present

## 2022-11-16 DIAGNOSIS — K219 Gastro-esophageal reflux disease without esophagitis: Secondary | ICD-10-CM | POA: Diagnosis not present

## 2022-12-13 ENCOUNTER — Other Ambulatory Visit: Payer: Self-pay | Admitting: Physical Medicine and Rehabilitation

## 2022-12-13 DIAGNOSIS — M8448XA Pathological fracture, other site, initial encounter for fracture: Secondary | ICD-10-CM

## 2022-12-13 DIAGNOSIS — M5416 Radiculopathy, lumbar region: Secondary | ICD-10-CM

## 2022-12-13 DIAGNOSIS — M48062 Spinal stenosis, lumbar region with neurogenic claudication: Secondary | ICD-10-CM

## 2022-12-15 ENCOUNTER — Telehealth: Payer: Self-pay | Admitting: Physical Medicine and Rehabilitation

## 2022-12-15 NOTE — Telephone Encounter (Signed)
LVM to return call to schedule injection 

## 2022-12-15 NOTE — Telephone Encounter (Signed)
Patient called returning your phone call. CB#[phone]

## 2022-12-22 ENCOUNTER — Ambulatory Visit: Payer: Medicare Other | Admitting: Physical Medicine and Rehabilitation

## 2022-12-22 ENCOUNTER — Other Ambulatory Visit: Payer: Self-pay

## 2022-12-22 VITALS — BP 120/69 | HR 73

## 2022-12-22 DIAGNOSIS — M5416 Radiculopathy, lumbar region: Secondary | ICD-10-CM | POA: Diagnosis not present

## 2022-12-22 MED ORDER — METHYLPREDNISOLONE ACETATE 40 MG/ML IJ SUSP
40.0000 mg | Freq: Once | INTRAMUSCULAR | Status: AC
  Administered 2022-12-22: 40 mg

## 2022-12-22 NOTE — Progress Notes (Unsigned)
Functional Pain Scale - descriptive words and definitions  Distracting (5)    Aware of pain/able to complete some ADL's but limited by pain/sleep is affected and active distractions are only slightly useful. Moderate range order  Average Pain 5   +Driver, -BT, -Dye Allergies.  Lower back pain on left side

## 2022-12-22 NOTE — Patient Instructions (Signed)

## 2022-12-23 NOTE — Procedures (Signed)
Lumbar Epidural Steroid Injection - Interlaminar Approach with Fluoroscopic Guidance  Patient: Joy Freeman      Date of Birth: 11/21/1940 MRN: [id number] PCP: Toma Deiters, MD      Visit Date: 12/22/2022   Universal Protocol:     Consent Given By: the patient  Position: PRONE  Additional Comments: Vital signs were monitored before and after the procedure. Patient was prepped and draped in the usual sterile fashion. The correct patient, procedure, and site was verified.   Injection Procedure Details:   Procedure diagnoses: Lumbar radiculopathy [M54.16]   Meds Administered:  Meds ordered this encounter  Medications   methylPREDNISolone acetate (DEPO-MEDROL) injection 40 mg     Laterality: Left  Location/Site:  L4-5  Needle: 3.5 in., 20 ga. Tuohy  Needle Placement: Paramedian epidural  Findings:   -Comments: Excellent flow of contrast into the epidural space.  Procedure Details: Using a paramedian approach from the side mentioned above, the region overlying the inferior lamina was localized under fluoroscopic visualization and the soft tissues overlying this structure were infiltrated with 4 ml. of 1% Lidocaine without Epinephrine. The Tuohy needle was inserted into the epidural space using a paramedian approach.   The epidural space was localized using loss of resistance along with counter oblique bi-planar fluoroscopic views.  After negative aspirate for air, blood, and CSF, a 2 ml. volume of Isovue-250 was injected into the epidural space and the flow of contrast was observed. Radiographs were obtained for documentation purposes.    The injectate was administered into the level noted above.   Additional Comments:  No complications occurred Dressing: 2 x 2 sterile gauze and Band-Aid    Post-procedure details: Patient was observed during the procedure. Post-procedure instructions were reviewed.  Patient left the clinic in stable condition.

## 2022-12-23 NOTE — Progress Notes (Signed)
Joy Freeman - 82 y.o. female MRN [id number]  Date of birth: August 25, 1940  Office Visit Note: Visit Date: 12/22/2022 PCP: Toma Deiters, MD Referred by: Toma Deiters, MD  Subjective: Chief Complaint  Patient presents with   Lower Back - Pain   HPI:  Joy Freeman is a 82 y.o. female who comes in today for planned repeat Left L4-5  Lumbar Interlaminar epidural steroid injection with fluoroscopic guidance.  The patient has failed conservative care including home exercise, medications, time and activity modification.  This injection will be diagnostic and hopefully therapeutic.  Please see requesting physician notes for further details and justification. Patient received more than 50% pain relief from prior injection.   Referring: Ellin Goodie, FNP   ROS Otherwise per HPI.  Assessment & Plan: Visit Diagnoses:    ICD-10-CM   1. Lumbar radiculopathy  M54.16 XR C-ARM NO REPORT    Epidural Steroid injection    methylPREDNISolone acetate (DEPO-MEDROL) injection 40 mg      Plan: No additional findings.   Meds & Orders:  Meds ordered this encounter  Medications   methylPREDNISolone acetate (DEPO-MEDROL) injection 40 mg    Orders Placed This Encounter  Procedures   XR C-ARM NO REPORT   Epidural Steroid injection    Follow-up: Return for visit to requesting provider as needed.   Procedures: No procedures performed  Lumbar Epidural Steroid Injection - Interlaminar Approach with Fluoroscopic Guidance  Patient: Joy Freeman      Date of Birth: 1941-01-10 MRN: [id number] PCP: Toma Deiters, MD      Visit Date: 12/22/2022   Universal Protocol:     Consent Given By: the patient  Position: PRONE  Additional Comments: Vital signs were monitored before and after the procedure. Patient was prepped and draped in the usual sterile fashion. The correct patient, procedure, and site was verified.   Injection Procedure Details:   Procedure diagnoses: Lumbar  radiculopathy [M54.16]   Meds Administered:  Meds ordered this encounter  Medications   methylPREDNISolone acetate (DEPO-MEDROL) injection 40 mg     Laterality: Left  Location/Site:  L4-5  Needle: 3.5 in., 20 ga. Tuohy  Needle Placement: Paramedian epidural  Findings:   -Comments: Excellent flow of contrast into the epidural space.  Procedure Details: Using a paramedian approach from the side mentioned above, the region overlying the inferior lamina was localized under fluoroscopic visualization and the soft tissues overlying this structure were infiltrated with 4 ml. of 1% Lidocaine without Epinephrine. The Tuohy needle was inserted into the epidural space using a paramedian approach.   The epidural space was localized using loss of resistance along with counter oblique bi-planar fluoroscopic views.  After negative aspirate for air, blood, and CSF, a 2 ml. volume of Isovue-250 was injected into the epidural space and the flow of contrast was observed. Radiographs were obtained for documentation purposes.    The injectate was administered into the level noted above.   Additional Comments:  No complications occurred Dressing: 2 x 2 sterile gauze and Band-Aid    Post-procedure details: Patient was observed during the procedure. Post-procedure instructions were reviewed.  Patient left the clinic in stable condition.   Clinical History: MRI LUMBAR SPINE WITHOUT CONTRAST   TECHNIQUE:  Multiplanar, multisequence MR imaging of the lumbar spine was  performed. No intravenous contrast was administered.   COMPARISON:  None Available.   FINDINGS:  Segmentation: 5 lumbar type vertebrae   Alignment:  Levoscoliosis and hyperlordosis.   Vertebrae:  Marrow edema in both sacral ala and crossing the S2 body,  partially covered. Hypointense fracture planes are better  demonstrated at the right sacral ala. No aggressive bone lesion is  noted. Small hypointensity at the L2 body is  not darker than disc  space.   Conus medullaris and cauda equina: Conus extends to the L1-2 level.  Conus and cauda equina appear normal.   Paraspinal and other soft tissues: Negative for perispinal mass or  inflammation.   Disc levels:   T12- L1: Disc collapse and endplate degeneration with central  protrusion.   L1-L2: Disc collapse and endplate degeneration with disc bulging and  right paracentral protrusion.   L2-L3: Disc collapse and endplate degeneration eccentric towards the  right where there is endplate and facet spurring.   L3-L4: Disc narrowing and endplate degeneration with disc bulging.  Degenerative facet spurring on both sides. Moderate bilateral  foraminal narrowing   L4-L5: Disc narrowing and bulging with left foraminal protrusion.  Moderate spinal stenosis. Moderate left foraminal narrowing   L5-S1:Mild disc bulging and facet spurring.   IMPRESSION:  1. Acute or subacute sacral insufficiency fractures.  2. Advanced lumbar spine degeneration with scoliosis.  3. 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    Electronically Signed    By: Tiburcio Pea M.D.    On: 12/26/2021 04:57     Objective:  VS:  HT:    WT:   BMI:     BP:120/69  HR:73bpm  TEMP: ( )  RESP:  Physical Exam Vitals and nursing note reviewed.  Constitutional:      General: She is not in acute distress.    Appearance: Normal appearance. She is not ill-appearing.  HENT:     Head: Normocephalic and atraumatic.     Right Ear: External ear normal.     Left Ear: External ear normal.  Eyes:     Extraocular Movements: Extraocular movements intact.  Cardiovascular:     Rate and Rhythm: Normal rate.     Pulses: Normal pulses.  Pulmonary:     Effort: Pulmonary effort is normal. No respiratory distress.  Abdominal:     General: There is no distension.     Palpations: Abdomen is soft.  Musculoskeletal:        General: Tenderness present.     Cervical back: Neck supple.      Right lower leg: No edema.     Left lower leg: No edema.     Comments: Patient has good distal strength with no pain over the greater trochanters.  No clonus or focal weakness.  Skin:    Findings: No erythema, lesion or rash.  Neurological:     General: No focal deficit present.     Mental Status: She is alert and oriented to person, place, and time.     Sensory: No sensory deficit.     Motor: No weakness or abnormal muscle tone.     Coordination: Coordination normal.  Psychiatric:        Mood and Affect: Mood normal.        Behavior: Behavior normal.      Imaging: XR C-ARM NO REPORT  Result Date: 12/22/2022 Please see Notes tab for imaging impression.

## 2023-01-19 DIAGNOSIS — Z23 Encounter for immunization: Secondary | ICD-10-CM | POA: Diagnosis not present

## 2023-03-16 DIAGNOSIS — M0589 Other rheumatoid arthritis with rheumatoid factor of multiple sites: Secondary | ICD-10-CM | POA: Diagnosis not present

## 2023-03-16 DIAGNOSIS — G43109 Migraine with aura, not intractable, without status migrainosus: Secondary | ICD-10-CM | POA: Diagnosis not present

## 2023-03-16 DIAGNOSIS — M5432 Sciatica, left side: Secondary | ICD-10-CM | POA: Diagnosis not present

## 2023-03-16 DIAGNOSIS — K219 Gastro-esophageal reflux disease without esophagitis: Secondary | ICD-10-CM | POA: Diagnosis not present

## 2023-03-16 DIAGNOSIS — N182 Chronic kidney disease, stage 2 (mild): Secondary | ICD-10-CM | POA: Diagnosis not present

## 2023-03-16 DIAGNOSIS — I10 Essential (primary) hypertension: Secondary | ICD-10-CM | POA: Diagnosis not present

## 2023-03-16 DIAGNOSIS — E7849 Other hyperlipidemia: Secondary | ICD-10-CM | POA: Diagnosis not present

## 2023-03-16 DIAGNOSIS — M810 Age-related osteoporosis without current pathological fracture: Secondary | ICD-10-CM | POA: Diagnosis not present

## 2023-06-29 DIAGNOSIS — M810 Age-related osteoporosis without current pathological fracture: Secondary | ICD-10-CM | POA: Diagnosis not present

## 2023-06-29 DIAGNOSIS — Z1331 Encounter for screening for depression: Secondary | ICD-10-CM | POA: Diagnosis not present

## 2023-06-29 DIAGNOSIS — M5432 Sciatica, left side: Secondary | ICD-10-CM | POA: Diagnosis not present

## 2023-06-29 DIAGNOSIS — G43109 Migraine with aura, not intractable, without status migrainosus: Secondary | ICD-10-CM | POA: Diagnosis not present

## 2023-06-29 DIAGNOSIS — K219 Gastro-esophageal reflux disease without esophagitis: Secondary | ICD-10-CM | POA: Diagnosis not present

## 2023-06-29 DIAGNOSIS — I10 Essential (primary) hypertension: Secondary | ICD-10-CM | POA: Diagnosis not present

## 2023-06-29 DIAGNOSIS — N182 Chronic kidney disease, stage 2 (mild): Secondary | ICD-10-CM | POA: Diagnosis not present

## 2023-06-29 DIAGNOSIS — M0589 Other rheumatoid arthritis with rheumatoid factor of multiple sites: Secondary | ICD-10-CM | POA: Diagnosis not present

## 2023-06-29 DIAGNOSIS — E7849 Other hyperlipidemia: Secondary | ICD-10-CM | POA: Diagnosis not present

## 2023-06-29 DIAGNOSIS — Z0000 Encounter for general adult medical examination without abnormal findings: Secondary | ICD-10-CM | POA: Diagnosis not present

## 2023-07-11 DIAGNOSIS — D1801 Hemangioma of skin and subcutaneous tissue: Secondary | ICD-10-CM | POA: Diagnosis not present

## 2023-07-11 DIAGNOSIS — L821 Other seborrheic keratosis: Secondary | ICD-10-CM | POA: Diagnosis not present

## 2023-07-11 DIAGNOSIS — B351 Tinea unguium: Secondary | ICD-10-CM | POA: Diagnosis not present

## 2023-08-15 ENCOUNTER — Telehealth: Payer: Self-pay | Admitting: Physical Medicine and Rehabilitation

## 2023-08-15 ENCOUNTER — Other Ambulatory Visit: Payer: Self-pay | Admitting: Physical Medicine and Rehabilitation

## 2023-08-15 DIAGNOSIS — M5416 Radiculopathy, lumbar region: Secondary | ICD-10-CM

## 2023-08-15 NOTE — Telephone Encounter (Signed)
 Pt called to set an appt for injection with [facility]. Please call pt at [phone]

## 2023-09-15 ENCOUNTER — Ambulatory Visit: Admitting: Physical Medicine and Rehabilitation

## 2023-09-15 ENCOUNTER — Other Ambulatory Visit: Payer: Self-pay

## 2023-09-15 DIAGNOSIS — M5416 Radiculopathy, lumbar region: Secondary | ICD-10-CM | POA: Diagnosis not present

## 2023-09-15 NOTE — Patient Instructions (Signed)

## 2023-09-15 NOTE — Progress Notes (Signed)
 Pain Scale   Average Pain 6 Patient advised she has chronic lower back pain radiating to left leg. Patient advising her pain is constant.        +Driver, -BT, -Dye Allergies.

## 2023-09-15 NOTE — Progress Notes (Signed)
 Joy Freeman - 83 y.o. female MRN [id number]  Date of birth: 1940/07/10  Office Visit Note: Visit Date: 09/15/2023 PCP: Orpha Yancey LABOR, MD Referred by: Orpha Yancey LABOR, MD  Subjective: Chief Complaint  Patient presents with   Lower Back - Pain   HPI:  Joy Freeman is a 83 y.o. female who comes in today for planned repeat Left L4-5  Lumbar Interlaminar epidural steroid injection with fluoroscopic guidance.  The patient has failed conservative care including home exercise, medications, time and activity modification.  This injection will be diagnostic and hopefully therapeutic.  Please see requesting physician notes for further details and justification. Patient received more than 50% pain relief from prior injection.   Referring: Duwaine Pouch, FNP and Dr. Oneil Herald   ROS Otherwise per HPI.  Assessment & Plan: Visit Diagnoses:    ICD-10-CM   1. Lumbar radiculopathy  M54.16 XR C-ARM NO REPORT    Epidural Steroid injection      Plan: No additional findings.   Meds & Orders: No orders of the defined types were placed in this encounter.   Orders Placed This Encounter  Procedures   XR C-ARM NO REPORT   Epidural Steroid injection    Follow-up: Return for visit to requesting provider as needed.   Procedures: No procedures performed  Lumbar Epidural Steroid Injection - Interlaminar Approach with Fluoroscopic Guidance  Patient: Joy Freeman      Date of Birth: 04-01-1940 MRN: [id number] PCP: Orpha Yancey LABOR, MD      Visit Date: 09/15/2023   Universal Protocol:     Consent Given By: the patient  Position: PRONE  Additional Comments: Vital signs were monitored before and after the procedure. Patient was prepped and draped in the usual sterile fashion. The correct patient, procedure, and site was verified.   Injection Procedure Details:   Procedure diagnoses: Lumbar radiculopathy [M54.16]   Meds Administered: No orders of the defined types were placed in  this encounter.    Laterality: Left  Location/Site:  L4-5  Needle: 3.5 in., 20 ga. Tuohy  Needle Placement: Paramedian epidural  Findings:   -Comments: Excellent flow of contrast into the epidural space.  Procedure Details: Using a paramedian approach from the side mentioned above, the region overlying the inferior lamina was localized under fluoroscopic visualization and the soft tissues overlying this structure were infiltrated with 4 ml. of 1% Lidocaine  without Epinephrine. The Tuohy needle was inserted into the epidural space using a paramedian approach.   The epidural space was localized using loss of resistance along with counter oblique bi-planar fluoroscopic views.  After negative aspirate for air, blood, and CSF, a 2 ml. volume of Isovue-250 was injected into the epidural space and the flow of contrast was observed. Radiographs were obtained for documentation purposes.    The injectate was administered into the level noted above.   Additional Comments:  The patient tolerated the procedure well Dressing: 2 x 2 sterile gauze and Band-Aid    Post-procedure details: Patient was observed during the procedure. Post-procedure instructions were reviewed.  Patient left the clinic in stable condition.   Clinical History: MRI LUMBAR SPINE WITHOUT CONTRAST   TECHNIQUE:  Multiplanar, multisequence MR imaging of the lumbar spine was  performed. No intravenous contrast was administered.   COMPARISON:  None Available.   FINDINGS:  Segmentation: 5 lumbar type vertebrae   Alignment:  Levoscoliosis and hyperlordosis.   Vertebrae: Marrow edema in both sacral ala and crossing the S2 body,  partially covered. Hypointense fracture planes are better  demonstrated at the right sacral ala. No aggressive bone lesion is  noted. Small hypointensity at the L2 body is not darker than disc  space.   Conus medullaris and cauda equina: Conus extends to the L1-2 level.  Conus and cauda  equina appear normal.   Paraspinal and other soft tissues: Negative for perispinal mass or  inflammation.   Disc levels:   T12- L1: Disc collapse and endplate degeneration with central  protrusion.   L1-L2: Disc collapse and endplate degeneration with disc bulging and  right paracentral protrusion.   L2-L3: Disc collapse and endplate degeneration eccentric towards the  right where there is endplate and facet spurring.   L3-L4: Disc narrowing and endplate degeneration with disc bulging.  Degenerative facet spurring on both sides. Moderate bilateral  foraminal narrowing   L4-L5: Disc narrowing and bulging with left foraminal protrusion.  Moderate spinal stenosis. Moderate left foraminal narrowing   L5-S1:Mild disc bulging and facet spurring.   IMPRESSION:  1. Acute or subacute sacral insufficiency fractures.  2. Advanced lumbar spine degeneration with scoliosis.  3. 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    Electronically Signed    By: Dorn Roulette M.D.    On: 12/26/2021 04:57     Objective:  VS:  HT:    WT:   BMI:     BP:   HR: bpm  TEMP: ( )  RESP:  Physical Exam Vitals and nursing note reviewed.  Constitutional:      General: She is not in acute distress.    Appearance: Normal appearance. She is not ill-appearing.  HENT:     Head: Normocephalic and atraumatic.     Right Ear: External ear normal.     Left Ear: External ear normal.  Eyes:     Extraocular Movements: Extraocular movements intact.  Cardiovascular:     Rate and Rhythm: Normal rate.     Pulses: Normal pulses.  Pulmonary:     Effort: Pulmonary effort is normal. No respiratory distress.  Abdominal:     General: There is no distension.     Palpations: Abdomen is soft.  Musculoskeletal:        General: Tenderness present.     Cervical back: Neck supple.     Right lower leg: No edema.     Left lower leg: No edema.     Comments: Patient has good distal strength with no pain over  the greater trochanters.  No clonus or focal weakness.  Skin:    Findings: No erythema, lesion or rash.  Neurological:     General: No focal deficit present.     Mental Status: She is alert and oriented to person, place, and time.     Sensory: No sensory deficit.     Motor: No weakness or abnormal muscle tone.     Coordination: Coordination normal.  Psychiatric:        Mood and Affect: Mood normal.        Behavior: Behavior normal.      Imaging: No results found.

## 2023-09-19 NOTE — Procedures (Signed)
 Lumbar Epidural Steroid Injection - Interlaminar Approach with Fluoroscopic Guidance  Patient: Joy Freeman      Date of Birth: 12-19-1940 MRN: [id number] PCP: Orpha Yancey LABOR, MD      Visit Date: 09/15/2023   Universal Protocol:     Consent Given By: the patient  Position: PRONE  Additional Comments: Vital signs were monitored before and after the procedure. Patient was prepped and draped in the usual sterile fashion. The correct patient, procedure, and site was verified.   Injection Procedure Details:   Procedure diagnoses: Lumbar radiculopathy [M54.16]   Meds Administered: No orders of the defined types were placed in this encounter.    Laterality: Left  Location/Site:  L4-5  Needle: 3.5 in., 20 ga. Tuohy  Needle Placement: Paramedian epidural  Findings:   -Comments: Excellent flow of contrast into the epidural space.  Procedure Details: Using a paramedian approach from the side mentioned above, the region overlying the inferior lamina was localized under fluoroscopic visualization and the soft tissues overlying this structure were infiltrated with 4 ml. of 1% Lidocaine  without Epinephrine. The Tuohy needle was inserted into the epidural space using a paramedian approach.   The epidural space was localized using loss of resistance along with counter oblique bi-planar fluoroscopic views.  After negative aspirate for air, blood, and CSF, a 2 ml. volume of Isovue-250 was injected into the epidural space and the flow of contrast was observed. Radiographs were obtained for documentation purposes.    The injectate was administered into the level noted above.   Additional Comments:  The patient tolerated the procedure well Dressing: 2 x 2 sterile gauze and Band-Aid    Post-procedure details: Patient was observed during the procedure. Post-procedure instructions were reviewed.  Patient left the clinic in stable condition.

## 2023-10-06 DIAGNOSIS — M810 Age-related osteoporosis without current pathological fracture: Secondary | ICD-10-CM | POA: Diagnosis not present

## 2023-10-06 DIAGNOSIS — M0589 Other rheumatoid arthritis with rheumatoid factor of multiple sites: Secondary | ICD-10-CM | POA: Diagnosis not present

## 2023-10-06 DIAGNOSIS — Z0000 Encounter for general adult medical examination without abnormal findings: Secondary | ICD-10-CM | POA: Diagnosis not present

## 2023-10-06 DIAGNOSIS — N182 Chronic kidney disease, stage 2 (mild): Secondary | ICD-10-CM | POA: Diagnosis not present

## 2023-10-06 DIAGNOSIS — G43109 Migraine with aura, not intractable, without status migrainosus: Secondary | ICD-10-CM | POA: Diagnosis not present

## 2023-10-06 DIAGNOSIS — E7849 Other hyperlipidemia: Secondary | ICD-10-CM | POA: Diagnosis not present

## 2023-10-06 DIAGNOSIS — M5432 Sciatica, left side: Secondary | ICD-10-CM | POA: Diagnosis not present

## 2023-10-06 DIAGNOSIS — K219 Gastro-esophageal reflux disease without esophagitis: Secondary | ICD-10-CM | POA: Diagnosis not present

## 2023-10-06 DIAGNOSIS — I10 Essential (primary) hypertension: Secondary | ICD-10-CM | POA: Diagnosis not present

## 2023-12-24 DIAGNOSIS — Z803 Family history of malignant neoplasm of breast: Secondary | ICD-10-CM | POA: Diagnosis not present

## 2023-12-24 DIAGNOSIS — R7989 Other specified abnormal findings of blood chemistry: Secondary | ICD-10-CM | POA: Diagnosis not present

## 2023-12-24 DIAGNOSIS — R5381 Other malaise: Secondary | ICD-10-CM | POA: Diagnosis present

## 2023-12-24 DIAGNOSIS — I214 Non-ST elevation (NSTEMI) myocardial infarction: Secondary | ICD-10-CM | POA: Diagnosis present

## 2023-12-24 DIAGNOSIS — R030 Elevated blood-pressure reading, without diagnosis of hypertension: Secondary | ICD-10-CM | POA: Diagnosis not present

## 2023-12-24 DIAGNOSIS — R519 Headache, unspecified: Secondary | ICD-10-CM | POA: Diagnosis not present

## 2023-12-24 DIAGNOSIS — R531 Weakness: Secondary | ICD-10-CM | POA: Diagnosis not present

## 2023-12-24 DIAGNOSIS — Z8052 Family history of malignant neoplasm of bladder: Secondary | ICD-10-CM | POA: Diagnosis not present

## 2023-12-24 DIAGNOSIS — H9190 Unspecified hearing loss, unspecified ear: Secondary | ICD-10-CM | POA: Diagnosis present

## 2023-12-24 DIAGNOSIS — I341 Nonrheumatic mitral (valve) prolapse: Secondary | ICD-10-CM | POA: Diagnosis present

## 2023-12-24 DIAGNOSIS — K219 Gastro-esophageal reflux disease without esophagitis: Secondary | ICD-10-CM | POA: Diagnosis present

## 2023-12-24 DIAGNOSIS — M1990 Unspecified osteoarthritis, unspecified site: Secondary | ICD-10-CM | POA: Diagnosis present

## 2023-12-24 DIAGNOSIS — R5383 Other fatigue: Secondary | ICD-10-CM | POA: Diagnosis present

## 2023-12-24 DIAGNOSIS — Z7982 Long term (current) use of aspirin: Secondary | ICD-10-CM | POA: Diagnosis not present

## 2023-12-24 DIAGNOSIS — I160 Hypertensive urgency: Secondary | ICD-10-CM | POA: Diagnosis present

## 2023-12-24 DIAGNOSIS — R42 Dizziness and giddiness: Secondary | ICD-10-CM | POA: Diagnosis not present

## 2023-12-24 DIAGNOSIS — I10 Essential (primary) hypertension: Secondary | ICD-10-CM | POA: Diagnosis present

## 2023-12-24 DIAGNOSIS — E871 Hypo-osmolality and hyponatremia: Secondary | ICD-10-CM | POA: Diagnosis present

## 2023-12-24 DIAGNOSIS — R569 Unspecified convulsions: Secondary | ICD-10-CM | POA: Diagnosis not present

## 2023-12-24 DIAGNOSIS — R0600 Dyspnea, unspecified: Secondary | ICD-10-CM | POA: Diagnosis not present

## 2023-12-24 DIAGNOSIS — G43909 Migraine, unspecified, not intractable, without status migrainosus: Secondary | ICD-10-CM | POA: Diagnosis present

## 2023-12-24 DIAGNOSIS — Z1152 Encounter for screening for COVID-19: Secondary | ICD-10-CM | POA: Diagnosis not present

## 2023-12-24 DIAGNOSIS — Z79899 Other long term (current) drug therapy: Secondary | ICD-10-CM | POA: Diagnosis not present

## 2023-12-24 DIAGNOSIS — Z882 Allergy status to sulfonamides status: Secondary | ICD-10-CM | POA: Diagnosis not present

## 2023-12-26 DIAGNOSIS — I252 Old myocardial infarction: Secondary | ICD-10-CM | POA: Diagnosis not present

## 2023-12-26 DIAGNOSIS — G9340 Encephalopathy, unspecified: Secondary | ICD-10-CM | POA: Diagnosis not present

## 2023-12-26 DIAGNOSIS — K219 Gastro-esophageal reflux disease without esophagitis: Secondary | ICD-10-CM | POA: Diagnosis present

## 2023-12-26 DIAGNOSIS — I2129 ST elevation (STEMI) myocardial infarction involving other sites: Secondary | ICD-10-CM | POA: Diagnosis not present

## 2023-12-26 DIAGNOSIS — E861 Hypovolemia: Secondary | ICD-10-CM | POA: Diagnosis present

## 2023-12-26 DIAGNOSIS — Z23 Encounter for immunization: Secondary | ICD-10-CM | POA: Diagnosis not present

## 2023-12-26 DIAGNOSIS — G9341 Metabolic encephalopathy: Secondary | ICD-10-CM | POA: Diagnosis present

## 2023-12-26 DIAGNOSIS — R636 Underweight: Secondary | ICD-10-CM | POA: Diagnosis present

## 2023-12-26 DIAGNOSIS — I214 Non-ST elevation (NSTEMI) myocardial infarction: Secondary | ICD-10-CM | POA: Diagnosis not present

## 2023-12-26 DIAGNOSIS — Z7902 Long term (current) use of antithrombotics/antiplatelets: Secondary | ICD-10-CM | POA: Diagnosis not present

## 2023-12-26 DIAGNOSIS — M1990 Unspecified osteoarthritis, unspecified site: Secondary | ICD-10-CM | POA: Diagnosis present

## 2023-12-26 DIAGNOSIS — I517 Cardiomegaly: Secondary | ICD-10-CM | POA: Diagnosis not present

## 2023-12-26 DIAGNOSIS — I499 Cardiac arrhythmia, unspecified: Secondary | ICD-10-CM | POA: Diagnosis not present

## 2023-12-26 DIAGNOSIS — I10 Essential (primary) hypertension: Secondary | ICD-10-CM | POA: Diagnosis not present

## 2023-12-26 DIAGNOSIS — G43909 Migraine, unspecified, not intractable, without status migrainosus: Secondary | ICD-10-CM | POA: Diagnosis present

## 2023-12-26 DIAGNOSIS — R7989 Other specified abnormal findings of blood chemistry: Secondary | ICD-10-CM | POA: Diagnosis not present

## 2023-12-26 DIAGNOSIS — R569 Unspecified convulsions: Secondary | ICD-10-CM | POA: Diagnosis present

## 2023-12-26 DIAGNOSIS — I160 Hypertensive urgency: Secondary | ICD-10-CM | POA: Diagnosis not present

## 2023-12-26 DIAGNOSIS — E222 Syndrome of inappropriate secretion of antidiuretic hormone: Secondary | ICD-10-CM | POA: Diagnosis present

## 2023-12-26 DIAGNOSIS — Z7982 Long term (current) use of aspirin: Secondary | ICD-10-CM | POA: Diagnosis not present

## 2023-12-26 DIAGNOSIS — Z6820 Body mass index (BMI) 20.0-20.9, adult: Secondary | ICD-10-CM | POA: Diagnosis not present

## 2023-12-26 DIAGNOSIS — Z888 Allergy status to other drugs, medicaments and biological substances status: Secondary | ICD-10-CM | POA: Diagnosis not present

## 2023-12-26 DIAGNOSIS — I341 Nonrheumatic mitral (valve) prolapse: Secondary | ICD-10-CM | POA: Diagnosis present

## 2023-12-26 DIAGNOSIS — E785 Hyperlipidemia, unspecified: Secondary | ICD-10-CM | POA: Diagnosis present

## 2023-12-26 DIAGNOSIS — N3000 Acute cystitis without hematuria: Secondary | ICD-10-CM | POA: Diagnosis present

## 2023-12-26 DIAGNOSIS — B954 Other streptococcus as the cause of diseases classified elsewhere: Secondary | ICD-10-CM | POA: Diagnosis present

## 2023-12-26 DIAGNOSIS — E871 Hypo-osmolality and hyponatremia: Secondary | ICD-10-CM | POA: Diagnosis not present

## 2023-12-26 DIAGNOSIS — R112 Nausea with vomiting, unspecified: Secondary | ICD-10-CM | POA: Diagnosis not present

## 2023-12-26 DIAGNOSIS — R464 Slowness and poor responsiveness: Secondary | ICD-10-CM | POA: Diagnosis not present

## 2023-12-26 DIAGNOSIS — R519 Headache, unspecified: Secondary | ICD-10-CM | POA: Diagnosis not present

## 2024-01-01 NOTE — Discharge Summary (Signed)
 ------------------------------------------------------------------------------- Attestation signed by Belvie Beverley Lango, MD at 01/02/2024  7:49 AM I have provided general supervision and was available to [name], PA-C, I did not see or evaluate the patient. -------------------------------------------------------------------------------    Hospital Medicine Discharge Summary   Demographics: Joy Freeman  83 y.o. December 11, 1940 MRN: [id number]    Full Code  Admit Date: 12/26/2023                            Attending Physician: Belvie Beverley Lango, MD Discharge Date: 01/01/2024  Primary Care Provider: No primary care provider on file.   None  Active & Resolved Diagnosis: Principal Problem:   Hyponatremia Active Problems:   Underweight   Hyperlipidemia   Primary hypertension Resolved Problems:   Acute encephalopathy   Elevated troponin   Hypophosphatemia   Hypomagnesemia   Acute cystitis without hematuria  Hospital Course: HPI: Joy Freeman is a 83 y.o. female with PMH including HTN, HLD, and GERD, who presented to [facility] ED on 12/26/2023 from [facility]. Per H&P: She presented to [facility] ED on 12/24/23 with dizziness/lightheadedness, left-sided headache, sinus drainage, generalized weakness and fatigue for about 2 weeks. In the ED, labs significant for hyponatremia (128), troponin elevated (46 >> 46 >> 54), EKG=SR, CTH negative. She was admitted for observation for elevated troponin and concern for NSTEMI. While at OSH, troponin peaked at 126. Patient was treated with heparin infusion, plavix, olmesartan, statin, her bisoprolol  hydrochlorothiazide  and aspirin  were continued. On 11/3, nursing reported seizure like activity and AMS. Serum sodium 112. Patient was treated with hypertonic saline 100cc. Repeat serum sodium 136. Tertiary Care referral initiated. Patient accepted to [facility] CCU for continued evaluation and management of acute  encephalopathy and possible seizure in the setting of hyponatremia and NSTEMI.  Upon arrival to CCU, patient is somnolent and oriented to self, disoriented to location, situation, year. She is afebrile, HDS, on 2L. GCS=2-4-5(11). Patient opens eyes to deep trapezius pain, patient localizes to pain stimulus with all extremities, patient can state her name with pain stimulus and repeated prompting. PERRL 2/2.  Hospital Course: Patient was admitted to CCU for evaluation and treatment of acute metabolic encephalopathy due to hyponatremia as well as elevated troponin. While the latter was being treated as NSTEMI at the OSH, patient's clinical presentation was not consistent with such (no chest pain, no EKG changes, and only relatively mildly elevation). Rather, it was felt that her elevated troponin was more likely simply the result of demand ischemia from her other acute issues, specifically her hyponatremia. Therefore, heparin drip was not continued. TTE was obtained and showed Normal LV systolic function. Diastolic dysfunction, stage 1. No significant valvular disease. Left atrial enlargement. No prior.  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 Because patient apparently had seizure activity following overcorrection of her Na at the OSH, she was given DDAVP to reverse this. Following stability, she was then started on fluid restriction and PO salt tabs. With these interventions, patient's Na improved but remained stable in the 120s. Tele-Nephro was consulted who agreed with current treatment plan, and recommended against urea  at that time. Given her clinical stability, patient was transferred to Med/Surg on 12/30/23. There, salt tabs were continued. Patient's Na was trended and proven to be stable ~126-128. However, her BPs were uncontrolled, perhaps as a consequence of  the salt tabs. For  better BP control, Cozaar  (in place of her home Benicar) was up-titrated.   During this hospital stay, patient was evaluated by PT and OT, both of whom recommended SNF. Case Management was involved to help with discharge planning. Patient and her daughter elected instead to return home with [facility] so Case Management helped to establish these services.   Recommendations:  Today, patient says she feels great! and excited to go home. She denies any physical complaints. She reports that her appetite seems to be getting better. She is drinking within her fluid restriction. She is voiding normally. Her last BM was this morning. Her VS are WNL and stable. Her labs show stable hyponatremia to 129 and are otherwise WNL and stable. Given this clinical improvement and stability, patient is ready for discharge today, Sun 01/01/2024.  Disposition: Patient will be discharged home with [facility] services and prescriptions for salt tabs. She was given verbal and written education about SIADH and its management strategies, including fluid restriction.   Follow-up: Patient will need to see her PCP as soon as possible for hospital f/u and repeat labs, health maintenance, and long-term management of her comorbidities, including HTN, HLD, and GERD, which were also managed during this admission. There were no acute events related to these conditions during this hospital stay. Patient will continue home meds at discharge with the addition above along with higher dose of Benicar. Discharge med rec was reviewed and approved by Pharmacy prior to signing. 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 A copy of this note will be sent to PCP on file with us  to ensure follow-up and continuity of care.  Physical Exam: Temp:  [97.6 F (36.4 C)-98.9 F (37.2 C)] 98.2 F (36.8 C) Heart Rate:  [98-105] 104 Resp:  [17-18]  17 BP: (151-184)/(75-89) 182/80  Constitutional: Patient sitting up in chair, in no apparent physical distress. Appears elderly and frail, but not ill.  RESP: On RA. Normal respiratory rate and effort. Psych: Awake, alert, and interactive. Cooperative with commands and answers questions appropriately. Very pleasant, affect normal, mood good.     Discharge Medications     New Medications      Sig Disp Refill Start End  sodium chloride  1 gram tablet  Take 2 tablets (2 g total) by mouth in the morning and 2 tablets (2 g total) at noon and 2 tablets (2 g total) in the evening. Take with meals.  540 tablet  1         Modified Medications      Sig Disp Refill Start End  olmesartan 20 mg tablet Commonly known as: BENICAR What changed:  medication strength how much to take  Take 1 tablet (20 mg total) by mouth daily.  90 tablet  1         Medications To Continue      Sig Disp Refill Start End  acetaminophen  325 mg tablet Commonly known as: TYLENOL   Take 650 mg by mouth every 6 (six) hours as needed.   0     aspirin  81 mg chewable tablet  Chew 81 mg daily.   0     atorvastatin 80 mg tablet Commonly known as: LIPITOR  Take 80 mg by mouth daily.   0     calcium carbonate 1500 mg (600 mg calcium) tablet Commonly known as: OS-CAL  Take  1 tablet by mouth daily.   0     cholecalciferol 5,000 unit (125 mcg) Tab tablet Commonly known as: VITAMIN D3  Take 5,000 Units by mouth daily.   0     clopidogreL 75 mg tablet Commonly known as: PLAVIX  Take 75 mg by mouth daily.   0     EXCEDRIN  MIGRAINE ORAL  Take 1 tablet by mouth 2 (two) times a day as needed (migraine).   0     hydroxychloroquine  200 mg tablet Commonly known as: PLAQUENIL   Take 200 mg by mouth 2 (two) times a day.   0     ondansetron  4 mg disintegrating tablet Commonly known as: ZOFRAN -ODT  Dissolve 4 mg on tongue every 8 (eight) hours as needed.   0         Stopped Medications     famotidine 20 mg tablet Commonly known as: PEPCID       Discharge Orders     Activity Instructions:     Details:    Activity Limits:  No activity limits As you are able Continue physical therapy (PT) Continue occupational therapy (OT)     Full Code     Home Health Skilled Nursing     Details:    Home Health Skilled Nursing Service:  Medication Cardiopulmonary Assessment     Medication Education: Observation and Assessment with Medication Education and Mgmt   Occupational Therapy Home Health Coordination     Details:    Actions:  Evaluate and Treat Home Safety Evaluation     Physical Therapy Home Health Coordination     Details:    Actions:  Evaluate and Treat Home Safety Evaluation     Regular diet     Details:    Diet type: Regular  Additional instructions:   Diagnosis: hyponatremia  Meds: START 2 g salt tabs three times daily; INCREASE Benicar to 20 mg daily; CONTINUE other home meds as currently prescribed Follow up with: your primary doctor as soon as possible for hospital follow-up, repeat labs, and to discuss referral to Nephrology Other: participate in home health physical and occupational therapies      Occupational Therapy Recommendations: Rehab Potential: Good Complexity/Co-morbidities that Impact POC: Cardiac compromise, Reduced activity level, Severity of condition, Time since onset/Exacerbation Impairments/Limitations: Activity Tolerance Deficits, Basic Activity of Daily living deficits, Muscle weakness, Safety Awareness deficits   Lab Results  Component Value Date/Time   HGB 11.1 (L) 12/30/2023 03:49 AM   HCT 31.2 (L) 12/30/2023 03:49 AM   WBC 8.30 12/30/2023 03:49 AM   PLT 392 12/30/2023 03:49 AM   Lab Results  Component Value Date/Time   NA 129 (L) 01/01/2024 05:22 AM   K 3.8 01/01/2024 05:22 AM   CREATININE 0.50 (L) 01/01/2024 05:22 AM   BUN 11 01/01/2024 05:22 AM   GLUCOSE 103 (H) 01/01/2024 05:22 AM    Pertinent  Imaging: Transthoracic echo (TTE) complete  Final Result by Izetta Earnie Leash, MD (11/03 1523)                                                    [facility]                                                   [facility]                                                  [place],                                                   [place]. [address]                                   Transthoracic Echocardiogram Report  Name  GENIYA, FULGHAM                        Study Date  12-26-2023                   Height  63 in  MRN  [id number]                                 Patient Location  [id number]                Weight  113 lb  DOB  29-Dec-1940                               Gender  Female                           BSA  1.5 m2  Age  46 yrs  Ethnicity  3                             BP  141-65 mmHg  Reason For Study  Myocardial infarction  Ordering Physician  HARVEY^ANNA^E             Performed By  BALTAZAR MEMS  Referring Physician  HARVEY, ANNA E  PROCEDURE  Image Quality  Fair.  SUMMARY  Normal LV systolic function.  Diastolic dysfunction, stage 1.  No significant valvular disease.  Left atrial enlargement.  No prior.  FINDINGS   LEFT VENTRICLE  The left ventricular size is normal with normal left ventricular wall   thickness. Left ventricular  systolic function is normal. LV ejection fraction = 55-60%. Left   ventricular filling pattern is  prolonged relaxation. No segmental wall motion abnormalities seen in the   left ventricle.  RIGHT VENTRICLE  The right ventricle is normal in size and function.  LEFT ATRIUM  The left atrium is mildly dilated.  RIGHT ATRIUM  Right  atrial size is normal.  AORTIC VALVE  The aortic valve is normal in structure and function. There is no aortic   regurgitation.  MITRAL VALVE  The mitral valve is normal in structure and function. There is no mitral   regurgitation noted.  TRICUSPID VALVE  Structurally normal tricuspid valve. There is mild tricuspid   regurgitation.  PULMONIC VALVE  Structurally normal pulmonic valve. There is no pulmonic valvular   regurgitation.  ARTERIES  The aortic sinus is normal size.  VENOUS  Pulmonary venous flow pattern is normal. IVC size was normal.  EFFUSION  There is no pericardial effusion.  MMode-2D Measurements & Calculations  IVSd  1.1 cm             LA dim  3.6 cm            IVS-LVPW  1.0                  ESV MOD-sp4    LVIDd  3.7 cm            RVDd  2.7 cm              FS  36.4 %                     15.0 ml  LVPWd  1.1 cm            EDV MOD-sp4   43.0 ml     EDV Teich   58.4 ml  LVIDs  2.4 cm                                      ESV Teich   19.2 ml              ___________________________________________________________________________  ______  EDV cubed   50.9 ml      LV mass C d  127.1 grams  SV Teich   39.1 ml             Ao root area   ESV cubed   13.1 ml      LV mass C dI              SI Teich   25.8 ml-m2          5.8 cm2  EF cubed  74.3 %        83.7 grams-m2             SV cubed   37.8 ml             ACS  2.0 cm                                                     SI cubed   24.9 ml-m2              ___________________________________________________________________________  ______  LA-Ao  1.3               SV MOD-sp4   28.0 ml      Indexed LVEDV  4Ch    LVOT diam  1.8 cm        SI MOD-sp4   18.5 ml-m2   28.3 ml-m2  LVOT area traced    2.5 cm2  Doppler Measurements & Calculations  MV E max vel  115.9 cm-sec      MV dec time  0.27 sec   SV LVOT   52.4 ml          LV V1 VTI  21.2 cm  MV A max vel  131.4 cm-sec                              Ao V2 max  151.2    cm-sec  MV E-A  0.88                                            Ao max PG  9.1   mmHg  E-Lat E`  22.2                                          Ao max PG  full    E-Med E`  20.5                                          4.5 mmHg                                                          Ao V2 mean  97.7   cm-sec                                                          Ao mean PG  4.5   mmHg  Ao mean PG  full                                                            2.5 mmHg                                                          Ao V2 VTI  27.9 cm                                                          AVA  VTI   1.9 cm2              ___________________________________________________________________________  ______  SV Ao   161.9 ml                PA max PG  3.4 mmHg     TR max vel  223.2   cm-sec RAP systole   SI Ao   106.7 ml-m2                                     TR max PG  20.0   mmHg     5.0 mmHg  SI LVOT   34.5 ml-m2                                    RVSP TR   25.0   mmHg              ___________________________________________________________________________  ______  AS Dimensionless Index  VTI     AVAi VTI  cm^2-m^2      SV index LVOT    0.76                                  1.2 cm2                 34.5 ml-m2  ___________________________________________________________________________  ___  Reading Physician                     MD Izetta Earnie Leash, MD, [phone] 12-26-2023 03 23 PM    XR Chest 1 View  Final Result by Deward Franky Conn, MD (11/03 1201)  XR CHEST 1 VIEW, 12/26/2023 11:51 AM    INDICATION: hyponatremia, hypoxia     COMPARISON: None    FINDINGS:     Cardiovascular: Mild Cardiomegaly.     Lungs/pleura: Clear.     Upper abdomen: No acute abnormality.     Osseous structures: No acute abnormality.      IMPRESSION:   Mild cardiomegaly. No overt pulmonary edema or other acute abnormality is    seen.        Extended Emergency Contact Information Primary Emergency Contact:  McGeee,Paul Mobile Phone: [phone] Relation: Spouse Preferred language: English Secondary Emergency Contact: miller,yvonne Mobile Phone: [phone] Relation: Daughter (daughter present at bedside during this encounter)    Electronically signed by: Jesusa Charlies Wonda Rodgers, PA-C 01/01/2024 12:43 PM   Time spent on discharge: 41 minutes

## 2024-01-01 NOTE — Progress Notes (Signed)
 Acute Physical Therapy Treatment  PERTINENT MEDICAL INFORMATION  Precautions: Other Therapy Precautions: Fall risk Comment: nausea  Medical Diagnosis/Course: PT Diagnosis/Course PT Medical Dx: NSTEMI Pertinent Medical Course: Pt was admitted to [facility] ED for dizziness, headache, and weakness. Pt observed to have elevated troponin and diagnosed with NSTEMI and transferred to [facility]. Therapies ordered to assess functional independence, functional mobility, and assist with discharge planning  ASSESSMENT SUMMARY   Joy Freeman is 83 y.o. presenting on 12/26/2023 with admission for NSTEMI. Pt seen for IP PT today as pt agreeable to try the therapy. Pt tol gait trg, no AD this session to improve her safety and endurance. Therapist highly enc use of AD as needed esp at night getting up to use the BR or first thing in the am until she is more steady. PLOF independent, no AD used. Pt verbalized understanding.   Problem list: Impairments/Limitations: Activity Tolerance Deficits, Ambulation Deficits, Balance Deficits, Mobility deficits, Bed mobility deficits, Transfer deficits   PT Recommendation: PT Recommendation: Home PT, Home with caregiver PT Equipment Recommended: None    Home Living Environment: Type of Home House  Home Layout Multi-level  Exterior Stairs - number 3 STE  Exterior Stairs - rails None  Interior Stairs - number flight to basement; flight to [employer] Stairs - Freight Forwarder point  Equipment Currently Using [facility] with long distances  Additional Comments does goes up and down stairs frequently as washer/dryer is in basement   Prior Level of Function:  Level of Independence Independent  Lives With Spouse  Person(s) able to assist at d/c Spouse 24/7; daughter could assist prn    SUBJECTIVE  Communication:  Communication: Patient, Nursing    Subjective: Pt reports doing better and being discharged later today.  OBJECTIVE  Pain: Pain  Assessment Pain Assessment: No/denies pain     Skin Integrity and Edema:  Skin Integrity & Edema Skin: Intact in visualized areas Edema: No edema    INTERVENTIONS  Functional Mobility:  Transfers Sit to Stand: Close supervision Stand to Sit: Close supervision Mobility Gait Distance (ft): 225 ft Gait Assistance: Hand Hold Assist Assistive Device: None  Skilled PT Treatment Provided  Gait w/o AD, HHA x 225 feet    Condition After Therapy:  Up in chair, Nursing notified of condition, All needs within reach, Lines intact  PERFORMANCE OUTCOME MEASURES  AM-PAC - Basic Mobility:    Flowsheet Row Most Recent Value  AM-PAC 6-Clicks - Basic Mobility  Turning from you back to your side while in a flat bed without using bed rails? None  Moving from lying on your back to sitting on the side of a flat bed without using bed rails? None  Moving to and from a bed to a chair (including a wheelchair)? None  Standing up from a chair using your arms (e.g, wheelchair, or bedside chair)? None  To walk in a hospital room? A little  Climbing 3-5 steps with a railing? A little  AM-PAC Total Score 22    PT PLAN OF CARE  Rehab Potential:  Rehab Potential: Good Complexity/Co-morbidities that Impact POC: Cognitive/Memory deficits, Reduced activity level, Severity of condition Impairments/Limitations: Activity Tolerance Deficits, Ambulation Deficits, Balance Deficits, Mobility deficits, Bed mobility deficits, Transfer deficits  Plan:  Patient and Family Goals: none stated Treatment Plan/Goals Established with Patient/Caregiver: Yes Planned Treatment/Interventions: Patient/Caregiver education, Therapeutic activity, Therapeutic exercise, Gait/mobility training, Neuromuscular re-education PT Frequency: 5x week  PT Goals:  Encounter Problems  Encounter Problems (Active)     Physical Therapy     Patient will go supine to/from sit with distant supervision     Start:  12/28/23    Expected  End:  01/07/24             Patient will perform sit-to-stand with distant supervision using LRAD     Start:  12/28/23    Expected End:  01/07/24             Patient will ambulate 100 feet with close supervision using LRAD     Start:  12/28/23    Expected End:  01/07/24         Pt will improve AM-PAC by 3 points to achieve MCID      Start:  12/28/23    Expected End:  01/07/24             TREATMENT TIME  Time In:   Time Out:   Time in Timed codes: 15 Total Treatment Time: 15     Treatment/Procedures Time Entry Gait/Mobility minutes: 15   Charges           01/01/2024   Code Description Service Provider Modifiers Quantity  [id number] Hc Pt Gait Training Therapy Verneita Birmingham, PT GP 1        Time of Service Note Type Status  None

## 2024-01-01 NOTE — Progress Notes (Signed)
 Case Management Discharge Note        CSN: [id number] DOB: 08/31/1940 Service: General Medicine Location: 341/1  Patient Class: Inpatient     Discharge Homecare Referral: (PT) Physical Therapy, (SN) Skilled Nursing, (OT) Occupational Therapy Homecare/Hospice Agency(s) chosen: Other out of state  Discharge Referrals Patient Preference: Chosen geographical local area/county shared with patient/family: Yes Date chosen geographical local area/county list shared with patient/family: 12/29/23 Patient Preference for Post-Acute Provider Form completed: Yes Case closed, patient/family agree with disposition plan: Yes  Case Management Coordination Status: Coordination Complete   HHPT, OT and skilled nursing through [facility] and [facility] in [place], [place].  Amber [employer], MSW, Risk Analyst II [facility]

## 2024-01-02 ENCOUNTER — Telehealth: Payer: Self-pay

## 2024-01-02 NOTE — Telephone Encounter (Signed)
 Reason for Conversation Medication Management (Discharged Patient  )  Background  Call returned x 2. Line busy with both calls.     Disposition  No Contact Calls  Reason for Disposition   .  Busy signal. First attempt to contact caller. Follow-up call scheduled within 15 minutes.  No Initial Assessment on file.  No Additional Information on file.  Protocols Used    No Contact or Duplicate Contact Call-Adult-AH

## 2024-01-02 NOTE — Transitions of Care (Post Inpatient/ED Visit) (Signed)
   01/02/2024  Name: Joy Freeman MRN: [id number] DOB: 04-05-1940  Today's TOC FU Call Status: Today's TOC FU Call Status:: Unsuccessful Call (1st Attempt) Unsuccessful Call (1st Attempt) Date: 01/02/24  Attempted to reach the patient regarding the most recent Inpatient/ED visit.  Follow Up Plan: Additional outreach attempts will be made to reach the patient to complete the Transitions of Care (Post Inpatient/ED visit) call.   Arvin Seip RN, BSN, CCM [employer], Population Health Case Manager Phone: [phone]

## 2024-01-02 NOTE — Telephone Encounter (Signed)
 Regarding: D/c Rx concern ----- Message from Tyra J sent at 01/02/2024  6:26 AM EST ----- D/c // [facility] // Hospitalist // Caller: Marshal // Caller states that the [facility] in [place], [place] filled all d/c prescriptions except for the Zofran  or Sodium Chloride . Wants to ensure that these were sent to the pharmacy

## 2024-01-03 ENCOUNTER — Telehealth: Payer: Self-pay

## 2024-01-03 DIAGNOSIS — I10 Essential (primary) hypertension: Secondary | ICD-10-CM | POA: Diagnosis not present

## 2024-01-03 DIAGNOSIS — E871 Hypo-osmolality and hyponatremia: Secondary | ICD-10-CM | POA: Diagnosis not present

## 2024-01-03 NOTE — Transitions of Care (Post Inpatient/ED Visit) (Signed)
   01/03/2024  Name: Joy Freeman MRN: [id number] DOB: October 02, 1940  Today's TOC FU Call Status: Today's TOC FU Call Status:: Unsuccessful Call (2nd Attempt) Unsuccessful Call (2nd Attempt) Date: 01/03/24  Attempted to reach the patient regarding the most recent Inpatient/ED visit.  Follow Up Plan: Additional outreach attempts will be made to reach the patient to complete the Transitions of Care (Post Inpatient/ED visit) call.   Christne Platts J. Michell Kader RN, MSN [facility], [facility] RN Care Manager Direct Dial: [phone]  Fax: [phone] Website: [email or web address]

## 2024-01-04 ENCOUNTER — Telehealth: Payer: Self-pay

## 2024-01-04 DIAGNOSIS — I10 Essential (primary) hypertension: Secondary | ICD-10-CM | POA: Diagnosis not present

## 2024-01-04 DIAGNOSIS — N182 Chronic kidney disease, stage 2 (mild): Secondary | ICD-10-CM | POA: Diagnosis not present

## 2024-01-04 NOTE — Transitions of Care (Post Inpatient/ED Visit) (Signed)
   01/04/2024  Name: Joy Freeman MRN: [id number] DOB: 04-Nov-1940  Today's TOC FU Call Status: Today's TOC FU Call Status:: Unsuccessful Call (3rd Attempt) Unsuccessful Call (1st Attempt) Date: 01/02/24 Unsuccessful Call (2nd Attempt) Date: 01/03/24 Unsuccessful Call (3rd Attempt) Date: 01/04/24  Attempted to reach the patient regarding the most recent Inpatient/ED visit.  Follow Up Plan: No further outreach attempts will be made at this time. We have been unable to contact the patient.  Medford Balboa, BSN, RN [place]  VBCI - [employer] [facility] RN Care Manager [phone]

## 2024-01-05 DIAGNOSIS — I161 Hypertensive emergency: Secondary | ICD-10-CM | POA: Diagnosis not present

## 2024-01-05 DIAGNOSIS — E871 Hypo-osmolality and hyponatremia: Secondary | ICD-10-CM | POA: Diagnosis not present

## 2024-01-13 DIAGNOSIS — E871 Hypo-osmolality and hyponatremia: Secondary | ICD-10-CM | POA: Diagnosis not present

## 2024-01-13 DIAGNOSIS — I161 Hypertensive emergency: Secondary | ICD-10-CM | POA: Diagnosis not present

## 2024-02-09 DIAGNOSIS — R5383 Other fatigue: Secondary | ICD-10-CM | POA: Diagnosis not present

## 2024-02-09 DIAGNOSIS — I10 Essential (primary) hypertension: Secondary | ICD-10-CM | POA: Diagnosis not present

## 2024-02-09 DIAGNOSIS — N182 Chronic kidney disease, stage 2 (mild): Secondary | ICD-10-CM | POA: Diagnosis not present
